# Patient Record
Sex: Male | Born: 1985 | Race: White | Hispanic: No | Marital: Married
Health system: Midwestern US, Community
[De-identification: ages and names within clinical notes are randomized; demographics above are authoritative.]

## PROBLEM LIST (undated history)

## (undated) ENCOUNTER — Emergency Department (HOSPITAL_COMMUNITY): Admission: EM | Payer: Self-pay | Source: Home / Self Care

## (undated) ENCOUNTER — Ambulatory Visit: Admission: EM | Source: Home / Self Care

## (undated) DIAGNOSIS — R42 Dizziness and giddiness: Secondary | ICD-10-CM

## (undated) DIAGNOSIS — R61 Generalized hyperhidrosis: Secondary | ICD-10-CM

## (undated) DIAGNOSIS — F411 Generalized anxiety disorder: Principal | ICD-10-CM

## (undated) DIAGNOSIS — R000 Tachycardia, unspecified: Secondary | ICD-10-CM

## (undated) HISTORY — DX: Tachycardia, unspecified: R00.0

## (undated) HISTORY — DX: Generalized anxiety disorder: F41.1

## (undated) HISTORY — PX: WISDOM TOOTH EXTRACTION: SHX21

## (undated) HISTORY — DX: Generalized hyperhidrosis: R61

## (undated) HISTORY — DX: Dizziness and giddiness: R42

---

## 2008-12-09 ENCOUNTER — Ambulatory Visit: Payer: Self-pay | Admitting: Family Medicine

## 2008-12-09 DIAGNOSIS — I499 Cardiac arrhythmia, unspecified: Secondary | ICD-10-CM | POA: Insufficient documentation

## 2008-12-09 DIAGNOSIS — R6882 Decreased libido: Secondary | ICD-10-CM

## 2008-12-09 LAB — CONVERTED CEMR LAB: TSH: 4.76 microintl units/mL (ref 0.35–5.50)

## 2008-12-12 ENCOUNTER — Telehealth: Payer: Self-pay | Admitting: Family Medicine

## 2008-12-12 ENCOUNTER — Encounter: Payer: Self-pay | Admitting: Family Medicine

## 2008-12-16 ENCOUNTER — Telehealth: Payer: Self-pay | Admitting: Family Medicine

## 2008-12-17 ENCOUNTER — Ambulatory Visit: Payer: Self-pay | Admitting: Endocrinology

## 2008-12-17 DIAGNOSIS — E230 Hypopituitarism: Secondary | ICD-10-CM | POA: Insufficient documentation

## 2008-12-17 DIAGNOSIS — E291 Testicular hypofunction: Secondary | ICD-10-CM

## 2008-12-17 LAB — CONVERTED CEMR LAB
FSH: 4.8 milliintl units/mL (ref 1.4–18.1)
LH: 4.39 milliintl units/mL (ref 1.50–9.30)
Testosterone: 340.08 ng/dL — ABNORMAL LOW (ref 350.00–890.00)

## 2009-12-26 ENCOUNTER — Ambulatory Visit: Payer: Self-pay | Admitting: Family Medicine

## 2009-12-26 DIAGNOSIS — J029 Acute pharyngitis, unspecified: Secondary | ICD-10-CM | POA: Insufficient documentation

## 2010-01-23 ENCOUNTER — Ambulatory Visit: Payer: Self-pay | Admitting: Family Medicine

## 2010-01-23 DIAGNOSIS — G4700 Insomnia, unspecified: Secondary | ICD-10-CM

## 2010-05-28 ENCOUNTER — Ambulatory Visit: Payer: Self-pay | Admitting: Family Medicine

## 2010-05-28 DIAGNOSIS — E039 Hypothyroidism, unspecified: Secondary | ICD-10-CM | POA: Insufficient documentation

## 2010-05-28 LAB — CONVERTED CEMR LAB
AST: 26 units/L (ref 0–37)
Albumin: 4.5 g/dL (ref 3.5–5.2)
Alkaline Phosphatase: 66 units/L (ref 39–117)
Basophils Absolute: 0 10*3/uL (ref 0.0–0.1)
Bilirubin, Direct: 0 mg/dL (ref 0.0–0.3)
Calcium: 9 mg/dL (ref 8.4–10.5)
GFR calc non Af Amer: 107.06 mL/min (ref >60)
Glucose, Bld: 87 mg/dL (ref 70–99)
Hemoglobin: 14.3 g/dL (ref 13.0–17.0)
Lymphocytes Relative: 30.3 % (ref 12.0–46.0)
Monocytes Relative: 7 % (ref 3.0–12.0)
Neutro Abs: 3.6 10*3/uL (ref 1.4–7.7)
RBC: 4.31 M/uL (ref 4.22–5.81)
RDW: 12.8 % (ref 11.5–14.6)
Sodium: 142 meq/L (ref 135–145)
Total Bilirubin: 0.5 mg/dL (ref 0.3–1.2)

## 2010-06-01 ENCOUNTER — Telehealth: Payer: Self-pay | Admitting: Family Medicine

## 2010-07-06 ENCOUNTER — Telehealth: Payer: Self-pay | Admitting: Family Medicine

## 2010-07-14 ENCOUNTER — Encounter: Payer: Self-pay | Admitting: Family Medicine

## 2010-07-20 ENCOUNTER — Encounter: Payer: Self-pay | Admitting: Family Medicine

## 2010-07-20 ENCOUNTER — Ambulatory Visit: Payer: Self-pay

## 2010-07-20 ENCOUNTER — Ambulatory Visit: Payer: Self-pay | Admitting: Family Medicine

## 2010-07-23 ENCOUNTER — Ambulatory Visit: Payer: Self-pay | Admitting: Cardiology

## 2010-07-23 DIAGNOSIS — R002 Palpitations: Secondary | ICD-10-CM | POA: Insufficient documentation

## 2010-07-28 ENCOUNTER — Ambulatory Visit: Payer: Self-pay | Admitting: Cardiology

## 2010-07-31 ENCOUNTER — Encounter: Payer: Self-pay | Admitting: Cardiology

## 2010-08-03 ENCOUNTER — Encounter (INDEPENDENT_AMBULATORY_CARE_PROVIDER_SITE_OTHER): Payer: Self-pay | Admitting: *Deleted

## 2010-08-03 ENCOUNTER — Ambulatory Visit: Payer: Self-pay

## 2010-08-03 ENCOUNTER — Ambulatory Visit (HOSPITAL_COMMUNITY)
Admission: RE | Admit: 2010-08-03 | Discharge: 2010-08-03 | Payer: Self-pay | Source: Home / Self Care | Attending: Cardiology | Admitting: Cardiology

## 2010-08-03 ENCOUNTER — Encounter: Payer: Self-pay | Admitting: Cardiology

## 2010-09-02 ENCOUNTER — Ambulatory Visit
Admission: RE | Admit: 2010-09-02 | Discharge: 2010-09-02 | Payer: Self-pay | Source: Home / Self Care | Attending: Cardiology | Admitting: Cardiology

## 2010-09-22 NOTE — Progress Notes (Signed)
  Phone Note Outgoing Call   Summary of Call: patient referred to Dr. Narda Bonds, ENT for further regulation.  Basic labs, normal Initial call taken by: Roderick Pee MD,  June 01, 2010 8:09 AM

## 2010-09-22 NOTE — Assessment & Plan Note (Signed)
Summary: elevated heart rate/cjr   Vital Signs:  Patient profile:   25 year old male Weight:      180 pounds Temp:     98.5 degrees F BP sitting:   120 / 84  (left arm) Cuff size:   regular  Vitals Entered By: Kern Reap CMA Duncan Dull) (July 20, 2010 10:37 AM) CC: heart racing    CC:  heart racing .  Allergies: No Known Drug Allergies   Complete Medication List: 1)  Ambien 10 Mg Tabs (Zolpidem tartrate) .Marland Kitchen.. 1 tab @ bedtime  Other Orders: Cardiology Referral (Cardiology) EKG w/ Interpretation (93000)   Orders Added: 1)  Cardiology Referral [Cardiology] 2)  EKG w/ Interpretation [93000] 3)  Est. Patient Level III [16109]

## 2010-09-22 NOTE — Assessment & Plan Note (Signed)
Summary: THYROID CONCERNS // RS..R/S PER MD//SLM   Vital Signs:  Patient profile:   25 year old male Weight:      178 pounds Temp:     98.0 degrees F oral BP sitting:   140 / 80  (left arm) Cuff size:   regular  Vitals Entered By: Kern Reap CMA Duncan Dull) (May 28, 2010 4:40 PM) CC: thyroid concerns Is Patient Diabetic? No Pain Assessment Patient in pain? no        CC:  thyroid concerns.  History of Present Illness: Benjamin Peters is a 25 year old male, who comes in today because he is concerned he may have a thyroid problem.  He brings in a long list of symptoms, that he's had over the last couple years.  It suggesting may have a thyroid problem.  Is also for the past couple months noted alteration in smell and taste.  For example, he states that every since peanut butter cup tastes like charcoal.  Review of systems negative.  Family history mother and two sisters have hypothyroidism  Allergies (verified): No Known Drug Allergies  Past History:  Past medical, surgical, family and social histories (including risk factors) reviewed, and no changes noted (except as noted below).  Past Medical History: Reviewed history from 12/09/2008 and no changes required. arrythmia hx hematuria post strep vertigo  Past Surgical History: Reviewed history from 12/09/2008 and no changes required. wisdom teeth extraction  Family History: Reviewed history from 12/17/2008 and no changes required. Father: HTN Mother: obesity Siblings: 2 younger - healthy neg for testosterone problem or male infertility Family History Thyroid disease  Social History: Reviewed history from 12/09/2008 and no changes required. Occupation:school teacher - social studies Married Never Smoked Alcohol use-no Drug use-no Regular exercise-yes  Review of Systems      See HPI       Flu Vaccine Consent Questions     Do you have a history of severe allergic reactions to this vaccine? no    Any prior  history of allergic reactions to egg and/or gelatin? no    Do you have a sensitivity to the preservative Thimersol? no    Do you have a past history of Guillan-Barre Syndrome? no    Do you currently have an acute febrile illness? no    Have you ever had a severe reaction to latex? no    Vaccine information given and explained to patient? yes    Are you currently pregnant? no    Lot Number:[id number]   Exp Date:02/20/2011   Site Given  Left Deltoid IM   Physical Exam  General:  Well-developed,well-nourished,in no acute distress; alert,appropriate and cooperative throughout examination Neck:  No deformities, masses, or tenderness noted.   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 1 tab @ bedtime  Other Orders: Admin 1st Vaccine (98119) Flu Vaccine 52yrs + (14782)  Patient Instructions: 1)  I will call you when  I get your lab work back Prescriptions: AMBIEN 10 MG TABS (ZOLPIDEM TARTRATE) 1 tab @ bedtime  #30 x 3   Entered and Authorized by:   Roderick Pee MD  Signed by:   Roderick Pee MD on 05/28/2010   Method used:   Print then Give to Patient   RxID:   [id number]

## 2010-09-22 NOTE — Progress Notes (Signed)
Summary: Pt req referral in cardiologist  Phone Note Call from Patient Call back at Home Phone [phone]   Caller: Patient Summary of Call: Pt called and is req a referral to cardiologist for recurring random heart palpitations. Pt req to have Echo-Cardiogram, 24hr Halter, 24hr urine sample and CT Scan of Edrinal glands.       Initial call taken by: Lucy Antigua,  July 06, 2010 9:57 AM  Follow-up for Phone Call         place set up a consult with Dr. Marca Ancona, in cardiology.  Diagnostic studies will be ordered by him Follow-up by: Roderick Pee MD,  July 06, 2010 10:19 AM

## 2010-09-22 NOTE — Assessment & Plan Note (Signed)
Summary: PINKEYE/PS   Vital Signs:  Patient profile:   25 year old male Weight:      167 pounds BMI:     21.52 Temp:     97.9 degrees F oral BP sitting:   130 / 90  (right arm)  Vitals Entered By: Kathrynn Speed CMA (January 23, 2010 1:36 PM) CC: Pink Eye/ Can't sleep   CC:  Pink Eye/ Can't sleep.  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 who comes in today for evaluation of two problems.  Yesterday he noticed some redness of his left eye.  No pain no visual disturbance.  He's also had trouble sleeping for the past month.  His grandmother, who he was very close to died.  He's not been able to go to sleep till 4 o'clock in the morning until last weekend when his grandfather gave him an Ambien tablet and he slept all night.  He would like some Ambien.  He denies any history of depression  Current Medications (verified): 1)  None  Allergies (verified): No Known Drug Allergies  Physical Exam  General:  Well-developed,well-nourished,in no acute distress; alert,appropriate and cooperative throughout examination Head:  Normocephalic and atraumatic without obvious abnormalities. No apparent alopecia or balding. Eyes:  right eye, normal left eye shows some erythema of the conjunctiva which does not extend over the cornea.  Visual acuity 2020 both eyes   Problems:  Medical Problems Added: 1)  Dx of Conjunctivitis  (ICD-372.30) 2)  Dx of Insomnia-sleep Disorder-unspec  (ICD-780.52)  Impression & Recommendations:  Problem # 1:  CONJUNCTIVITIS (ICD-372.30) Assessment New  His updated medication list for this problem includes:    Genoptic 0.3 % Soln (Gentamicin sulfate) .Marland Kitchen... 1 gtt two times a day  Orders: Prescription Created Electronically [phone])  Problem # 2:  INSOMNIA-SLEEP DISORDER-UNSPEC (ICD-780.52) Assessment: New  His updated medication list for this problem includes:    Ambien 10 Mg Tabs (Zolpidem tartrate)  .Marland Kitchen... 1 tab @ bedtime  Complete Medication List: 1)  Genoptic 0.3 % Soln (Gentamicin sulfate) .Marland Kitchen.. 1 gtt two times a day 2)  Ambien 10 Mg Tabs (Zolpidem tartrate) .Marland Kitchen.. 1 tab @ bedtime  Patient Instructions: 1)  use one drop of the medicated eye drops in ear, left eye twice a day until the redness is gone. 2)  Ambien 10 mg dose one half tab nightly Prescriptions: GENOPTIC 0.3 % SOLN (GENTAMICIN SULFATE) 1 gtt two times a day  #10 cc x 1   Entered and Authorized by:   Roderick Pee MD   Signed by:   Roderick Pee MD on 01/23/2010   Method used:   Electronically to        [facility]  [phone]* (retail)       [address]       [place], [place]  [address]       Ph: [phone] or [phone]       Fax: [phone]   RxID:   [id number] AMBIEN 10 MG TABS (ZOLPIDEM TARTRATE) 1 tab @ bedtime  #30 x 1   Entered and Authorized by:   Roderick Pee MD   Signed by:   Roderick Pee MD on 01/23/2010   Method used:   Print then Give to Patient   RxID:   [id number] GENOPTIC 0.3 % SOLN (GENTAMICIN SULFATE) 1 gtt two times a day  #10 cc x 1   Entered  and Authorized by:   Roderick Pee MD   Signed by:   Roderick Pee MD on 01/23/2010   Method used:   Electronically to        [facility]  [phone]* (retail)       [address]       [place], [place]  [address]       Ph: [phone] or [phone]       Fax: [phone]   RxID:   [id number]

## 2010-09-22 NOTE — Assessment & Plan Note (Signed)
Summary: cough/njr   Vital Signs:  Patient profile:   25 year old male Weight:      162 pounds Temp:     98.7 degrees F oral BP sitting:   120 / 90  (right arm) CC: Cough, Sore throat   CC:  Cough and Sore throat.  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  Three days ago, he developed cough, fever, chills yesterday, he developed a bad sore throat.  Review of systems otherwise negative.  One of his fellow, teachers at school, has been sick with similar symptoms  Allergies: No Known Drug Allergies  Past History:  Past medical, surgical, family and social histories (including risk factors) reviewed for relevance to current acute and chronic problems.  Past Medical History: Reviewed history from 12/09/2008 and no changes required. arrythmia hx hematuria post strep vertigo  Past Surgical History: Reviewed history from 12/09/2008 and no changes required. wisdom teeth extraction  Family History: Reviewed history from 12/17/2008 and no changes required. Father: HTN Mother: obesity Siblings: 2 younger - healthy neg for testosterone problem or male infertility  Social History: Reviewed history from 12/09/2008 and no changes required. Occupation:school teacher - social studies Married Never Smoked Alcohol use-no Drug use-no Regular exercise-yes  Review of Systems      See HPI  Physical Exam  General:  Well-developed,well-nourished,in no acute distress; alert,appropriate and cooperative throughout examination Head:  Normocephalic and atraumatic without obvious abnormalities. No apparent alopecia or balding. Eyes:  No corneal or conjunctival inflammation noted. EOMI. Perrla. Funduscopic exam benign, without hemorrhages, exudates or papilledema. Vision grossly normal. Ears:  External ear exam shows no significant lesions or deformities.  Otoscopic examination  reveals clear canals, tympanic membranes are intact bilaterally without bulging, retraction, inflammation or discharge. Hearing is grossly normal bilaterally. Nose:  External nasal examination shows no deformity or inflammation. Nasal mucosa are pink and moist without lesions or exudates. Mouth:  Oral mucosa and oropharynx without lesions or exudates.  Teeth in good repair. Neck:  No deformities, masses, or tenderness noted. Chest Wall:  No deformities, masses, tenderness or gynecomastia noted. Lungs:  Normal respiratory effort, chest expands symmetrically. Lungs are clear to auscultation, no crackles or wheezes.   Impression & Recommendations:  Problem # 1:  SORE THROAT (ICD-462) Assessment New  Orders: Rapid Strep (16109)  Complete Medication List: 1)  Hydromet 5-1.5 Mg/1ml Syrp (Hydrocodone-homatropine) .Marland Kitchen.. 1 to 2 tsps three times a day as needed  Patient Instructions: 1)  drink lots of liquids, suck on Chloraseptic lozenges, Hydromet one to 2 teaspoons t.i.d., p.r.n. sore throat, cough.  Return p.r.n. Prescriptions: HYDROMET 5-1.5 MG/5ML SYRP (HYDROCODONE-HOMATROPINE) 1 to 2 tsps three times a day as needed  #8oz x 1   Entered and Authorized by:   Roderick Pee MD   Signed by:   Roderick Pee MD on 12/26/2009   Method used:   Print then Give to Patient   RxID:   [id number]   Appended Document: Office Visit - Infectious Disease     Allergies: No Known Drug Allergies   Complete Medication List: 1)  Hydromet 5-1.5 Mg/15ml Syrp (Hydrocodone-homatropine) .Marland Kitchen.. 1 to 2 tsps three times a day as needed   Laboratory Results  Date/Time Received: Dec 26, 2009 4:47 PM  Date/Time Reported: Dec 26, 2009 4:47 PM   Other Tests  Rapid Strep: negative Comments: Kathrynn Speed CMA  Dec 26, 2009 4:47 PM

## 2010-09-22 NOTE — Assessment & Plan Note (Signed)
Summary: cardiac arrythmia/mt   Visit Type:  Initial Consult Primary Provider:  Roderick Pee MD  CC:  irregular heart beat.  History of Present Illness: 25 yo patient of Dr. Tawanna Cooler presents for evaluation of palpitations/sensation of racing heart rate as well as profuse diaphoresis/heat intolerance.  For the last 2-3 years, patient has noted frequent palpitations/heart fluttering.  This has been bothersome but momentary.  He has been off caffeine completely for 2 years without much effect.  More recently, he has noted that his heart rate increases excessively at times, especially when he "overheats."  He notes extreme heat intolerance with profuse diaphoresis.  When this happens, his heart will race and he will fee lightheaded.  No syncope.  Monday early morning he woke up with a very severe episode where he felt his heart racing.  This lasted for 10-15 minutes.  He notes this extreme heat intolerance especially when he tries to work out at [employer]  Sometimes it gets so bad (diaphoresis, heart racing, lightheadedness) that he has to lie down.  He has been wearing an event monitor for several days now.  It shows sinus rhythm and sinus tachycardia.  There has been one episode of tachycardia to the 150s.  This is difficult to interpret on the monitor strip and is most likely sinus tachycardia but cannot rule out SVT.  Patient does not have a history of HTN.   ECG: NSR with right axis deviation.  Labs (10/11): K 3.5, creatinine 0.9, TSH/free T4/free T3 normal  Current Medications (verified): 1)  Ambien 10 Mg Tabs (Zolpidem Tartrate) .Marland Kitchen.. 1 Tab @ Bedtime As Needed 2)  Multivitamins   Tabs (Multiple Vitamin) .... Once Daily  Allergies (verified): No Known Drug Allergies  Past History:  Past Medical History: 1. hx hematuria post strep 2. vertigo: sounds consistent with benign paroxysmal positional vertigo 3. Tachypalpitations  Family History: Father: HTN Mother: obesity Siblings: 2 younger  - healthy neg for testosterone problem or male infertility Family History Thyroid disease  No premature CAD.   Social History: Reviewed history from 12/09/2008 and no changes required. Occupation:school teacher - social studies Married Never Smoked Alcohol use-no Drug use-no Regular exercise-yes  Review of Systems       All systems reviewed and negative except as per HPI.   Vital Signs:  Patient profile:   25 year old male Height:      74 inches Weight:      178 pounds BMI:     22.94 Pulse rate:   96 / minute BP sitting:   128 / 78  (left arm) Cuff size:   regular  Vitals Entered By: Hardin Negus, RMA (July 23, 2010 2:23 PM)  Physical Exam  General:  Well developed, well nourished, in no acute distress. Head:  normocephalic and atraumatic Nose:  no deformity, discharge, inflammation, or lesions Mouth:  Teeth, gums and palate normal. Oral mucosa normal. Neck:  Neck supple, no JVD. No masses, thyromegaly or abnormal cervical nodes. Lungs:  Clear bilaterally to auscultation and percussion. Heart:  Hyperdynamic PMI, chest non-tender; regular rate and rhythm, S1, S2 without murmurs, rubs. +S4. Carotid upstroke normal, no bruit.  Pedals normal pulses. No edema, no varicosities. Abdomen:  Bowel sounds positive; abdomen soft and non-tender without masses, organomegaly, or hernias noted. No hepatosplenomegaly. Msk:  Back normal, normal gait. Muscle strength and tone normal. Extremities:  No clubbing or cyanosis. Neurologic:  Alert and oriented x 3. Skin:  Intact without lesions or rashes. Psych:  Normal affect.   Impression & Recommendations:  Problem # 1:  PALPITATIONS (ICD-785.1) Patient has very bothersome episodes where his heart races excesssively.  He gets lightheaded but has never passed out.  These episodes tend to be associated with profuse diaphoresis and heat intolerance.  His thyroid indices have been normal.  Event monitor has shown one episode with rate  up to 150 that is sinus tachycardia versus SVT.   - I think that even though the patient does not have a history of HTN, he needs to have pheochromocytoma ruled out as it could potentially present like this.  Will get 24 hour urine catecholamine collection as well as serum metanephrines.  - Echo to make sure that heart is structurally normal.  - Given quite bothersome symptoms and a lot of anxiety, I am going to start him on propranolol 40 mg two times a day. - Complete event monitor.  - Followup 1 month after testing complete.   Other Orders: EKG w/ Interpretation (93000) Metanephrines, Plasma [phone]) T- * Misc. Laboratory test [phone]) Echocardiogram (Echo)  Patient Instructions: 1)  Your physician has recommended you make the following change in your medication:  2)  Start Propranolol 40mg  twice a day--this will be two 20mg  tablets twice a day. 3)  Your physician recommends that you have lab today --serum metanephrine and a 24 urine collection for catecholamines  427.9  4)  Your physician has requested that you have an echocardiogram.  Echocardiography is a painless test that uses sound waves to create images of your heart. It provides your doctor with information about the size and shape of your heart and how well your heart's chambers and valves are working.  This procedure takes approximately one hour. There are no restrictions for this procedure. 5)  Your physician recommends that you schedule a follow-up appointment in: 1 month with Dr Shirlee Latch. Prescriptions: PROPRANOLOL HCL 20 MG TABS (PROPRANOLOL HCL) Take two tablets  by mouth twice a day  #120 x 6   Entered by:   Katina Dung, RN, BSN   Authorized by:   Marca Ancona, MD   Signed by:   Katina Dung, RN, BSN on 07/23/2010   Method used:   Electronically to        [facility]  [phone]* (retail)       [address]       [place], [place]  [address]       Ph: [phone] or [phone]        Fax: [phone]   RxID:   [id number]

## 2010-09-22 NOTE — Medication Information (Signed)
Summary: Prior Authorization and Approval for Zolpidem Tartrate  Prior Authorization and Approval for Zolpidem Tartrate   Imported By: Maryln Gottron 07/24/2010 10:16:24  _____________________________________________________________________  External Attachment:    Type:   Image     Comment:   External Document

## 2010-09-24 NOTE — Assessment & Plan Note (Signed)
Summary: 1 MONTH ROV   Visit Type:  Follow-up Primary Provider:  Roderick Pee MD  CC:  no complaints.  History of Present Illness: 25 yo returns for evaluation of palpitations/sensation of racing heart rate as well as profuse diaphoresis/heat intolerance.  For the last 2-3 years, patient has noted frequent palpitations/heart fluttering.  This has been bothersome but momentary.  He has been off caffeine completely for 2 years without much effect.  More recently, he has noted that his heart rate increases excessively at times, especially when he "overheats."  He notes extreme heat intolerance with profuse diaphoresis.  When this happens, his heart will race and he will fee lightheaded.  No syncope.  He notes extreme heat intolerance especially when he tries to work out at [employer]  Sometimes it would get so bad (diaphoresis, heart racing, lightheadedness) that he has to lie down.    Patient wore an event monitor that showed short runs of possible SVT with a maximal rate around 150.  He was started on propranolol mid-way through his time with the event monitor and states that palpitations resolved completely after starting propranolol.  No further tachycardic runs on monitor after that point.  He continues to feel like he sweats excessively.  We checked him for pheochromocytoma.  Urine and serum lab workup was negative.  Overall, he is feeling better without the palpitations.  Echo showed preserved LV and RV size and function.  Patient has tolerated propranolol well with no lightheadedness or fatigue.   Labs (10/11): K 3.5, creatinine 0.9, TSH/free T4/free T3 normal Labs (12/11): Urinary catecholeamines and plasma metanephrines/catecholeamines were normal  Current Medications (verified): 1)  Ambien 10 Mg Tabs (Zolpidem Tartrate) .Marland Kitchen.. 1 Tab @ Bedtime As Needed 2)  Multivitamins   Tabs (Multiple Vitamin) .... Once Daily 3)  Propranolol Hcl 20 Mg Tabs (Propranolol Hcl) .... Take Two Tablets  By Mouth  Twice A Day  Allergies (verified): No Known Drug Allergies  Past History:  Past Medical History: 1. hx hematuria post strep 2. vertigo: sounds consistent with benign paroxysmal positional vertigo 3. Tachypalpitations: 3 month event monitor showed runs of possible SVT with rate in 150s (difficult baseline so cannot completely rule out sinus tachycardia). Improved with propranolol.  4. Excessive diaphoresis: Lab workup for pheochromocytoma was negative.  5. Echo (12/11): EF 50-55%, normal diastolic function, no LVH, valves ok.   Family History: Reviewed history from 07/23/2010 and no changes required. Father: HTN Mother: obesity Siblings: 2 younger - healthy neg for testosterone problem or male infertility Family History Thyroid disease  No premature CAD.   Social History: Reviewed history from 12/09/2008 and no changes required. Occupation:school teacher - social studies Married Never Smoked Alcohol use-no Drug use-no Regular exercise-yes  Vital Signs:  Patient profile:   25 year old male Height:      74 inches Weight:      179.75 pounds BMI:     23.16 Pulse rate:   44 / minute BP sitting:   112 / 68  (right arm) Cuff size:   regular  Vitals Entered By: Micki Riley CNA (September 02, 2010 4:42 PM)  Physical Exam  General:  Well developed, well nourished, in no acute distress. Neck:  Neck supple, no JVD. No masses, thyromegaly or abnormal cervical nodes. Lungs:  Clear bilaterally to auscultation and percussion. Heart:  Hyperdynamic PMI, chest non-tender; regular rate and rhythm, S1, S2 without murmurs, rubs. Carotid upstroke normal, no bruit.  Pedals normal pulses. No edema, no  varicosities. Abdomen:  Bowel sounds positive; abdomen soft and non-tender without masses, organomegaly, or hernias noted. No hepatosplenomegaly. Extremities:  No clubbing or cyanosis. Neurologic:  Alert and oriented x 3. Psych:  Normal affect.   Impression & Recommendations:  Problem  # 1:  PALPITATIONS (ICD-785.1) Possible SVT on monitor.  This seems to be suppressed by propranolol and patient feels better.  Will continue propranolol.  No significant side effects at this time.  Echo with preserved cardiac function.   Problem # 2:  DIAPHORESIS Workup for pheochromocytoma was negative and thyroid indices were normal.  ? referral to endocrinology.   Patient Instructions: 1)  Your physician recommends that you continue on your current medications as directed. Please refer to the Current Medication list given to you today. 2)  Your physician wants you to follow-up in: 1 year.  You will receive a reminder letter in the mail two months in advance. If you don't receive a letter, please call our office to schedule the follow-up appointment.

## 2010-09-24 NOTE — Letter (Signed)
Summary: Outpatient Coinsurance Notice  Outpatient Coinsurance Notice   Imported By: Marylou Mccoy 08/10/2010 18:57:46  _____________________________________________________________________  External Attachment:    Type:   Image     Comment:   External Document

## 2011-01-11 ENCOUNTER — Telehealth: Payer: Self-pay | Admitting: Cardiology

## 2011-01-11 NOTE — Telephone Encounter (Signed)
Left a message to call back.

## 2011-01-11 NOTE — Telephone Encounter (Signed)
Pt has question regarding the propranolol 40 mg twice a day.

## 2011-01-12 NOTE — Telephone Encounter (Signed)
He can try to lower the dose to 20 mg bid.  It is purely for symptoms so if he is feeling worse on the medication than off, he should stop it.  We could try using a calcium channel blocker like diltiazem instead (could try diltiazem cd 120 mg daily).

## 2011-01-12 NOTE — Telephone Encounter (Signed)
I talked with pt. Pt taking Propranolol 40mg  twice a day. Since February pt has felt fatigued and not as energetic as in the past. Pt states he has gained weight because he does not feel as energetic.  Pt states he does not feel the medication has not been effective in suppressing the heart rhythm problem that he was first evaluated for. Pt states he still feels his heart beats backwards about once a week. This is less often than in the past. Pt states the monitor he ware did not show any significant rhythm problem. Pt asking if medication can be changed or adjusted.

## 2011-01-12 NOTE — Telephone Encounter (Signed)
I discussed with pt. For right now pt will decrease Propranolol to 20mg  bid. He will see how he gets along symptomatically on the lower dose of Propranolol. He will call back if he decides he would like to try Diltiazem CD 120mg .

## 2011-05-12 ENCOUNTER — Encounter: Payer: Self-pay | Admitting: Family Medicine

## 2011-05-12 ENCOUNTER — Ambulatory Visit (INDEPENDENT_AMBULATORY_CARE_PROVIDER_SITE_OTHER): Payer: BC Managed Care – PPO | Admitting: Family Medicine

## 2011-05-12 VITALS — BP 102/74 | Temp 98.7°F | Wt 184.0 lb

## 2011-05-12 DIAGNOSIS — J029 Acute pharyngitis, unspecified: Secondary | ICD-10-CM

## 2011-05-12 NOTE — Patient Instructions (Signed)
Viral Pharyngitis  Viral pharyngitis is a sore throat caused by a cold virus. This is not a strep throat. This does not require treatment by an antibiotic (medications that kill germs). It will get better on its own. Antibiotics generally will not help unless this condition is followed by a bacterial (germ) infection.  HOME CARE INSTRUCTIONS   Drink or give your child plenty of fluids. Soft, cold foods such as ice cream, popsicles, or jello may be used.    Gargle with warm salt water (one teaspoon salt per quart of water).    If over age 7, throat lozenges may be used safely.    Only take over-the-counter or prescription medicines for pain, discomfort, or fever as directed by your caregiver. DO NOT USE ASPIRIN.   SEEK MEDICAL CARE IF:   You are better in a few days, then become worse.    You have a fever or pain unresponsive to pain medicines.    There are any other changes that concern you.   Diagnostic tests may have been performed. If you have not received results at time of discharge, receive instructions as how to obtain these results.   Document Released: 05/19/2005 Document Re-Released: 01/26/2008  ExitCare Patient Information 2011 [employer].

## 2011-05-12 NOTE — Progress Notes (Signed)
  Subjective:    Patient ID: Benjamin Peters, male    DOB: 06-14-1986, 25 y.o.   MRN: [id number]  HPI Patient seen with sore throat. Onset today. Over the weekend developed some vertigo which has been recurrent process for him. He has nasal congestion of the weekend. Rare cough. No fever or chills. Patient denies any nausea, vomiting, or diarrhea. He has not taken any alleviating medications.   Review of Systems  Constitutional: Negative for fever, chills and appetite change.  HENT: Positive for congestion and sore throat. Negative for trouble swallowing and voice change.   Respiratory: Negative for cough, shortness of breath and wheezing.   Cardiovascular: Negative for chest pain.  Skin: Negative for rash.  Hematological: Negative for adenopathy.       Objective:   Physical Exam  Constitutional: He appears well-developed and well-nourished. No distress.  HENT:  Right Ear: External ear normal.  Left Ear: External ear normal.  Mouth/Throat: Oropharynx is clear and moist.       Mild erythema without exudate  Neck: Neck supple.  Cardiovascular: Normal rate, regular rhythm and normal heart sounds.   Pulmonary/Chest: Effort normal and breath sounds normal. No respiratory distress. He has no wheezes. He has no rales.  Lymphadenopathy:    He has no cervical adenopathy.          Assessment & Plan:  Pharyngitis. Rapid strep negative. Suspect viral origin. Treat symptomatically with over-the-counter medications. Followup as needed

## 2011-06-03 ENCOUNTER — Ambulatory Visit: Payer: BC Managed Care – PPO | Admitting: Internal Medicine

## 2011-06-14 ENCOUNTER — Ambulatory Visit (INDEPENDENT_AMBULATORY_CARE_PROVIDER_SITE_OTHER): Payer: BC Managed Care – PPO | Admitting: Family Medicine

## 2011-06-14 ENCOUNTER — Encounter: Payer: Self-pay | Admitting: Family Medicine

## 2011-06-14 VITALS — BP 100/68 | Temp 98.1°F | Wt 180.0 lb

## 2011-06-14 DIAGNOSIS — J45901 Unspecified asthma with (acute) exacerbation: Secondary | ICD-10-CM

## 2011-06-14 MED ORDER — HYDROCODONE-HOMATROPINE 5-1.5 MG/5ML PO SYRP: ORAL_SOLUTION | ORAL | Status: DC

## 2011-06-14 MED ORDER — PREDNISONE 20 MG PO TABS: ORAL_TABLET | ORAL | Status: DC

## 2011-06-14 NOTE — Patient Instructions (Signed)
Drink lots of water.  Take the prednisone as directed.  One half to 1 teaspoon of the cough syrup at bedtime as needed.  Return p.r.n.

## 2011-06-14 NOTE — Progress Notes (Signed)
  Subjective:    Patient ID: Benjamin Peters, male    DOB: 12/30/1985, 25 y.o.   MRN: [id number]  HPI Benjamin Peters is a 25 year old, married male, nonsmoker, who comes in with a two week history of a cold and a persistent cough.  He has had a viral syndrome now for about two weeks.  Today he feels like he is coughing and wheezing.  No history of asthma in the past.  He is a nonsmoker   Review of Systems General and pulmonary venous systems otherwise negative   Objective:   Physical Exam  Well-developed well-nourished, male in no acute distress.  Examination of the HEENT were negative.  Neck was supple.  No adenopathy.  Lungs were clear except for late bilateral mild expiratory wheezing      Assessment & Plan:  Viral syndrome with secondary asthma.  Plan prednisone burst and taper

## 2011-07-05 ENCOUNTER — Ambulatory Visit (INDEPENDENT_AMBULATORY_CARE_PROVIDER_SITE_OTHER): Payer: BC Managed Care – PPO | Admitting: Internal Medicine

## 2011-07-05 ENCOUNTER — Encounter: Payer: Self-pay | Admitting: Internal Medicine

## 2011-07-05 DIAGNOSIS — Z0000 Encounter for general adult medical examination without abnormal findings: Secondary | ICD-10-CM

## 2011-07-05 DIAGNOSIS — R6889 Other general symptoms and signs: Secondary | ICD-10-CM | POA: Insufficient documentation

## 2011-07-05 DIAGNOSIS — T679XXA Effect of heat and light, unspecified, initial encounter: Secondary | ICD-10-CM

## 2011-07-05 DIAGNOSIS — E291 Testicular hypofunction: Secondary | ICD-10-CM

## 2011-07-05 DIAGNOSIS — R002 Palpitations: Secondary | ICD-10-CM

## 2011-07-05 NOTE — Assessment & Plan Note (Addendum)
Td-- due and done today DRE done at pt's request ("my insurance demands it") Flu shot-- had already Diet-exercise-STE discussed Labs

## 2011-07-05 NOTE — Progress Notes (Signed)
  Subjective:    Patient ID: Benjamin Peters, male    DOB: 07-14-1986, 25 y.o.   MRN: [id number]  HPI Transferring from another office, needs a complete physical exam. He has 2 additional issues, heat intolerance and  Arrhythmias, see assessment and plan.  Past Medical History  Diagnosis Date  . Hematuria     hx - post strep (as a teenager)  . 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 Tachycardia   . Diaphoresis     excessive   Past Surgical History  Procedure Date  . Wisdom tooth extraction    History   Social History  . Marital Status: Married    Spouse Name: N/A    Number of Children: 0  . Years of Education: N/A   Occupational History  . teacher , high school    Social History Main Topics  . Smoking status: Never Smoker   . Smokeless tobacco: Never Used  . Alcohol Use: No  . Drug Use: No  . Sexually Active: Not on file   Other Topics Concern  . Not on file   Social History Narrative  . No narrative on file   Family History  Problem Relation Age of Onset  . Obesity Mother   . Hypertension Father   . Thyroid disease      "mother side", sisters   . Colon cancer Neg Hx   . Prostate cancer Neg Hx   . Coronary artery disease      GM MI, in her 21s     Review of Systems No fever or chills No weight loss No nausea, vomiting, diarrhea No chest pain or shortness of breath No dysuria or gross hematuria No anxiety or depression.     Objective:   Physical Exam  Constitutional: He is oriented to person, place, and time. He appears well-developed and well-nourished. No distress.  HENT:  Head: Normocephalic and atraumatic.  Neck: No thyromegaly present.  Cardiovascular: Normal rate and regular rhythm.   Pulmonary/Chest: Effort normal and breath sounds normal. No respiratory distress. He has no wheezes. He has no rales.  Abdominal: Soft. Bowel sounds are normal. He exhibits no distension. There is no tenderness. There is no rebound and no  guarding.  Genitourinary: Rectum normal and prostate normal.  Musculoskeletal: He exhibits no edema.  Neurological: He is alert and oriented to person, place, and time.  Skin: Skin is warm and dry. He is not diaphoretic.  Psychiatric: He has a normal mood and affect. His behavior is normal. Judgment and thought content normal.       Assessment & Plan:  In addition to his CPX, we spent more than 20 minutes reviewing the chart in reference to additional symptoms.

## 2011-07-05 NOTE — Assessment & Plan Note (Addendum)
palpitations since 2008, described as able to feel his heart beating for a short period of time , no associated red flag symptoms like syncope. Pheochromocytoma was ruled out 07/2010 when he saw cardiology Echocardiogram 07/2010 normal TFTs always normal.  event monitor   showed short runs of possible SVT with a maximal rate around 150, symptoms resolved after propranolol per cards note ---> pt states they did not resolved but he was intolerant to BB due to  fatigue. Discussed retrial w/ BB, declined. PVC? We agreed to observation, will call if sx increase Labs

## 2011-07-05 NOTE — Assessment & Plan Note (Signed)
Labs

## 2011-07-05 NOTE — Assessment & Plan Note (Addendum)
Long history of heat intolerance,  describes the symptoms as  "only comfortable in a very cold environment", "excessively sweaty and nauseous with exertion". Previous workup 1. ECHO, see palpitations  2. TFTs normal 3. pheo has been r/o 4. Low testosterone  Plan: Repeat TSH, testosterone

## 2011-07-06 LAB — COMPREHENSIVE METABOLIC PANEL
AST: 19 U/L (ref 0–37)
Albumin: 4.3 g/dL (ref 3.5–5.2)
BUN: 11 mg/dL (ref 6–23)
CO2: 30 mEq/L (ref 19–32)
Calcium: 9.2 mg/dL (ref 8.4–10.5)
Chloride: 105 mEq/L (ref 96–112)
GFR: 107.53 mL/min (ref >60.00)
Glucose, Bld: 84 mg/dL (ref 70–99)
Potassium: 4.5 mEq/L (ref 3.5–5.1)

## 2011-07-06 LAB — CBC WITH DIFFERENTIAL/PLATELET
Basophils Absolute: 0 10*3/uL (ref 0.0–0.1)
Basophils Relative: 0.6 % (ref 0.0–3.0)
Eosinophils Absolute: 0.2 10*3/uL (ref 0.0–0.7)
Lymphocytes Relative: 29.7 % (ref 12.0–46.0)
MCHC: 34.3 g/dL (ref 30.0–36.0)
Neutrophils Relative %: 59.7 % (ref 43.0–77.0)
RBC: 4.5 Mil/uL (ref 4.22–5.81)

## 2011-07-06 LAB — LIPID PANEL
HDL: 39.6 mg/dL (ref >39.00)
Triglycerides: 71 mg/dL (ref 0.0–149.0)

## 2011-07-06 LAB — TESTOSTERONE, FREE, TOTAL, SHBG: Testosterone-% Free: 2.8 % (ref 1.6–2.9)

## 2011-07-06 LAB — T3, FREE: T3, Free: 2.8 pg/mL (ref 2.3–4.2)

## 2011-07-08 ENCOUNTER — Encounter: Payer: Self-pay | Admitting: Internal Medicine

## 2012-06-27 ENCOUNTER — Encounter: Payer: Self-pay | Admitting: Family Medicine

## 2012-06-27 ENCOUNTER — Ambulatory Visit (INDEPENDENT_AMBULATORY_CARE_PROVIDER_SITE_OTHER): Payer: BC Managed Care – PPO | Admitting: Family Medicine

## 2012-06-27 VITALS — BP 118/72 | HR 58 | Temp 98.6°F | Wt 178.6 lb

## 2012-06-27 DIAGNOSIS — J40 Bronchitis, not specified as acute or chronic: Secondary | ICD-10-CM

## 2012-06-27 DIAGNOSIS — G4700 Insomnia, unspecified: Secondary | ICD-10-CM

## 2012-06-27 MED ORDER — AZITHROMYCIN 250 MG PO TABS: ORAL_TABLET | ORAL | Status: DC

## 2012-06-27 MED ORDER — ZOLPIDEM TARTRATE 10 MG PO TABS: 10.0000 mg | ORAL_TABLET | Freq: Every evening | ORAL | Status: DC | PRN

## 2012-06-27 NOTE — Progress Notes (Signed)
  Subjective:     Benjamin Peters is a 26 y.o. male who presents for evaluation of symptoms of a URI. Symptoms include bilateral ear pressure/pain, congestion, nasal congestion, no  fever, post nasal drip, productive cough with  green colored sputum and wheezing. Onset of symptoms was 6 weeks ago, and has been gradually worsening since that time. Treatment to date: antihistamines, cough suppressants and decongestants.  The following portions of the patient's history were reviewed and updated as appropriate: allergies, current medications, past family history, past medical history, past social history, past surgical history and problem list.  Review of Systems Pertinent items are noted in HPI.   Objective:    BP 118/72  Pulse 58  Temp 98.6 F (37 C) (Oral)  Wt 178 lb 9.6 oz (81.012 kg)  SpO2 98% General appearance: alert, cooperative and appears stated age Ears: normal TM's and external ear canals both ears and normal TM&#39;s and external ear canals both ears Nose: green discharge, mild congestion, turbinates red, swollen Throat: lips, mucosa, and tongue normal; teeth and gums normal Neck: no adenopathy, supple, symmetrical, trachea midline and thyroid not enlarged, symmetric, no tenderness/mass/nodules Lungs: diminished breath sounds bilaterally Heart: S1, S2 normal   Assessment:    bronchitis   Plan:    Suggested symptomatic OTC remedies. Nasal saline spray for congestion. Zithromax per orders. Follow up as needed.

## 2012-06-27 NOTE — Patient Instructions (Signed)

## 2012-11-24 ENCOUNTER — Telehealth: Payer: Self-pay | Admitting: Internal Medicine

## 2012-11-24 NOTE — Telephone Encounter (Signed)
Patient Information:  Caller Name: Levonne Spiller  Phone: [phone]  Patient: Benjamin Peters, Benjamin Peters  Gender: Male  DOB: 11-08-1985  Age: 27 Years  PCP: Willow Ora  Office Follow Up:  Does the office need to follow up with this patient?: Yes  Instructions For The Office: per office protocol, office prescribes antidepressants, anti anxiety and does not refer to [facility]/Psychiatry; info to office for provider review/callback.  krs/can  RN Note:  Patient states his wife left him a few weeks ago, and he is having a difficult time coping and sleeping.  States he is having trouble focusing at work.  States is seeing a therapist and getting treatment, but is ready to try medication.  Per depression protocol, disposition "call local agency now;" info to office via Epic/[facility] for staff/provider review/referral/callback.  May reach patient [phone].  krs/can  Symptoms  Reason For Call & Symptoms: insomnia, depression  Reviewed Health History In EMR: Yes  Reviewed Medications In EMR: Yes  Reviewed Allergies In EMR: Yes  Reviewed Surgeries / Procedures: Yes  Date of Onset of Symptoms: Unknown  Guideline(s) Used:  Depression  Disposition Per Guideline:   Call Local Agency Today  Reason For Disposition Reached:   Symptoms interfere with work or school  Advice Given:  N/A  Patient Will Follow Care Advice:  YES

## 2012-11-24 NOTE — Telephone Encounter (Signed)
Discussed with pt.  Scheduled appt 4.7.14

## 2012-11-24 NOTE — Telephone Encounter (Signed)
Please advise 

## 2012-11-24 NOTE — Telephone Encounter (Signed)
If severe sx or suicidal ideas: ER Otherwise schedule a OV ASAP

## 2012-11-27 ENCOUNTER — Ambulatory Visit (INDEPENDENT_AMBULATORY_CARE_PROVIDER_SITE_OTHER): Payer: BC Managed Care – PPO | Admitting: Internal Medicine

## 2012-11-27 ENCOUNTER — Encounter: Payer: Self-pay | Admitting: Internal Medicine

## 2012-11-27 VITALS — BP 122/82 | HR 81 | Temp 98.2°F | Wt 181.0 lb

## 2012-11-27 DIAGNOSIS — G4700 Insomnia, unspecified: Secondary | ICD-10-CM

## 2012-11-27 DIAGNOSIS — F411 Generalized anxiety disorder: Secondary | ICD-10-CM

## 2012-11-27 HISTORY — DX: Generalized anxiety disorder: F41.1

## 2012-11-27 MED ORDER — ZOLPIDEM TARTRATE 10 MG PO TABS: 10.0000 mg | ORAL_TABLET | Freq: Every evening | ORAL | Status: DC | PRN

## 2012-11-27 MED ORDER — ALPRAZOLAM 0.25 MG PO TABS: 0.2500 mg | ORAL_TABLET | Freq: Three times a day (TID) | ORAL | Status: DC | PRN

## 2012-11-27 MED ORDER — ESCITALOPRAM OXALATE 10 MG PO TABS: 10.0000 mg | ORAL_TABLET | Freq: Every day | ORAL | Status: DC

## 2012-11-27 NOTE — Assessment & Plan Note (Signed)
Cont ambien 

## 2012-11-27 NOTE — Progress Notes (Signed)
  Subjective:    Patient ID: Benjamin Peters, male    DOB: 1985/08/27, 27 y.o.   MRN: [id number]  HPI Here for evolution and treatment of anxiety. 3 weeks ago, his wife of 5 years left without warning, he was unable to eat for a week, did not sleep at all; after one week he started to take some steps to manage his problem: Has seen a counselor twice. His parents are involved and helping him significantly. He is experiencing panic attacks both at work and home. When they happen, he feels like he needs to go away running. He has been taking some Ambien, a leftover, with relatively good results.  Past Medical History  Diagnosis Date  . Hematuria     hx - post strep (as a teenager)  . 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 Tachycardia   . Diaphoresis     excessive   Past Surgical History  Procedure Laterality Date  . Wisdom tooth extraction     History   Social History  . Marital Status: Married    Spouse Name: N/A    Number of Children: 0  . Years of Education: N/A   Occupational History  . school Production designer, theatre/television/film   .     Social History Main Topics  . Smoking status: Never Smoker   . Smokeless tobacco: Never Used  . Alcohol Use: No  . Drug Use: No  . Sexually Active: Not on file   Other Topics Concern  . Not on file   Social History Narrative  . No narrative on file   Review of Systems  Appetite is coming back but has lost 13 pounds since the problem started. The first week, he had some suicidal thoughts but not since then.     Objective:   Physical Exam General -- alert, well-developed  .    Neurologic-- alert & oriented X3 and strength normal in all extremities. Psych-- Cognition and judgment appear intact. Alert and cooperative with normal attention span and concentration.  Anxious appearing, not depression noted.        Assessment & Plan:

## 2012-11-27 NOTE — Assessment & Plan Note (Addendum)
Severe anxiety state for 3 weeks, initially w/ depression, now depression is minimal, no suicidal ideas. Praised the fact that he reach for help--->  parents and  a Veterinary surgeon. He is also counseled today here at the office. We took about different options including SSRIs, vs benzos vs a combination of both. Eventually we agreed on: Lexapro, side effects discussed including suicidality. Ambien as needed at night. Low dose of Xanax, potential for abuse discussed, once Lexapro kicks in the need for Xanax should decrease. Today , I spent more than 25  min with the patient, >50% of the time counseling

## 2013-01-12 ENCOUNTER — Encounter: Payer: Self-pay | Admitting: Lab

## 2013-01-16 ENCOUNTER — Ambulatory Visit (INDEPENDENT_AMBULATORY_CARE_PROVIDER_SITE_OTHER): Payer: BC Managed Care – PPO | Admitting: Internal Medicine

## 2013-01-16 VITALS — BP 114/78 | HR 85 | Temp 98.4°F | Wt 173.0 lb

## 2013-01-16 DIAGNOSIS — L989 Disorder of the skin and subcutaneous tissue, unspecified: Secondary | ICD-10-CM

## 2013-01-16 DIAGNOSIS — F411 Generalized anxiety disorder: Secondary | ICD-10-CM

## 2013-01-16 MED ORDER — KETOCONAZOLE 2 % EX CREA: TOPICAL_CREAM | Freq: Two times a day (BID) | CUTANEOUS | Status: DC

## 2013-01-16 NOTE — Assessment & Plan Note (Signed)
Since the last time he was here, he took Lexapro, it helped a great deal. He d/c it a month ago and continued to feel well. At this point  does not need SSRIs, we agreed to keep him on Xanax which he takes rarely. Also recommend to continue seeing the counselor

## 2013-01-16 NOTE — Patient Instructions (Addendum)
Recommend to come back in 6 months for a physical

## 2013-01-16 NOTE — Assessment & Plan Note (Signed)
Skin lesion as described above, doubt melanoma or BCC. The patient is quite concerned about  significant sun exposure. Would like to see a dermatologist for a checkup. Plan: Ketoconazole Refer to dermatology

## 2013-01-16 NOTE — Progress Notes (Signed)
  Subjective:    Patient ID: Benjamin Peters, male    DOB: Dec 31, 1985, 27 y.o.   MRN: [id number]  HPI followup from previous visit. The patient took Lexapro for one month, it helped significantly. He continue with counseling. After a month, he felt  better and self discontinued Lexapro. Since then he continue to feel well. Also, a year ago noted a "pimple" they left arm, he drained it, he was left with dried patch of skin, for the last month, the area has enlarged, mildly itching.  Past Medical History  Diagnosis Date  . Hematuria     hx - post strep (as a teenager)  . 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 Tachycardia   . Diaphoresis     excessive  . Anxiety state, unspecified 11/27/2012    Past Surgical History  Procedure Laterality Date  . Wisdom tooth extraction      Review of Systems No depression. Still has occasional mild insomnia and takes Ambien when necessary. Also takes Xanax very rarely.    Objective:   Physical Exam  Constitutional: He appears well-developed and well-nourished. No distress.  Skin: He is not diaphoretic.     Psychiatric: He has a normal mood and affect. His behavior is normal. Judgment and thought content normal.      Assessment & Plan:

## 2013-01-18 ENCOUNTER — Encounter: Payer: Self-pay | Admitting: Internal Medicine

## 2013-02-02 ENCOUNTER — Telehealth: Payer: Self-pay | Admitting: Internal Medicine

## 2013-02-02 NOTE — Telephone Encounter (Signed)
Patient's ex-wife called asking questions about the patient's health. Spouse is listed on patient's DPR signed 07/07/12 appointing her, however they separated in March. Per Lynnell Jude, patient must call and give verbal that it is still okay to speak with her. I notified pt spouse that pt would need to call us. She understood.

## 2013-03-26 ENCOUNTER — Encounter: Payer: Self-pay | Admitting: Internal Medicine

## 2013-04-19 ENCOUNTER — Other Ambulatory Visit: Payer: Self-pay | Admitting: Internal Medicine

## 2013-04-19 ENCOUNTER — Telehealth: Payer: Self-pay | Admitting: Internal Medicine

## 2013-04-19 NOTE — Telephone Encounter (Signed)
Pt would like to see if he could get a refill on his zolpidem (AMBIEN) 10 MG tablet [[id number]]    Pharmacy: [id number] - [place], [place] - [address]

## 2013-04-19 NOTE — Telephone Encounter (Signed)
Ok to send #30, 1 RF Will get UDS at time of CPX in few months

## 2013-04-19 NOTE — Telephone Encounter (Signed)
Pt requesting refill for Ambien 10mg   Last OV-01/16/13  Last refill 11/27/12  No UDS contract.  Please advise.

## 2013-04-20 ENCOUNTER — Telehealth: Payer: Self-pay | Admitting: *Deleted

## 2013-04-20 ENCOUNTER — Other Ambulatory Visit: Payer: Self-pay | Admitting: *Deleted

## 2013-04-20 DIAGNOSIS — G4700 Insomnia, unspecified: Secondary | ICD-10-CM

## 2013-04-20 MED ORDER — ZOLPIDEM TARTRATE 10 MG PO TABS: 10.0000 mg | ORAL_TABLET | Freq: Every evening | ORAL | Status: DC | PRN

## 2013-04-20 NOTE — Telephone Encounter (Signed)
rx refilled- Ambien for Dr.Paz

## 2013-04-20 NOTE — Telephone Encounter (Signed)
Error rx  Has been fax.  [facility]

## 2013-04-20 NOTE — Telephone Encounter (Signed)
Rx fpr ambien faxed to [facility]

## 2013-05-16 ENCOUNTER — Ambulatory Visit (INDEPENDENT_AMBULATORY_CARE_PROVIDER_SITE_OTHER): Payer: BC Managed Care – PPO | Admitting: Internal Medicine

## 2013-05-16 ENCOUNTER — Ambulatory Visit (HOSPITAL_BASED_OUTPATIENT_CLINIC_OR_DEPARTMENT_OTHER)
Admission: RE | Admit: 2013-05-16 | Discharge: 2013-05-16 | Disposition: A | Discharge status: Home / Self Care | Payer: BC Managed Care – PPO | Source: Ambulatory Visit | Attending: Internal Medicine | Admitting: Internal Medicine

## 2013-05-16 ENCOUNTER — Encounter: Payer: Self-pay | Admitting: Internal Medicine

## 2013-05-16 VITALS — BP 122/84 | HR 96 | Temp 100.7°F | Wt 180.2 lb

## 2013-05-16 DIAGNOSIS — R05 Cough: Secondary | ICD-10-CM

## 2013-05-16 DIAGNOSIS — R059 Cough, unspecified: Secondary | ICD-10-CM | POA: Insufficient documentation

## 2013-05-16 DIAGNOSIS — R509 Fever, unspecified: Secondary | ICD-10-CM | POA: Insufficient documentation

## 2013-05-16 MED ORDER — HYDROCODONE-HOMATROPINE 5-1.5 MG/5ML PO SYRP: 5.0000 mL | ORAL_SOLUTION | Freq: Three times a day (TID) | ORAL | Status: DC | PRN

## 2013-05-16 MED ORDER — MOXIFLOXACIN HCL 400 MG PO TABS: 400.0000 mg | ORAL_TABLET | Freq: Every day | ORAL | Status: DC

## 2013-05-16 NOTE — Patient Instructions (Signed)
Get the XR at [facility], corner of [address] (10 minutes form here); they are open 24/7 [address]  [place], [place] [address] [phone] ----- Rest, fluids If fever or pain: tylenol or motrin OTC as needed For cough, take Mucinex DM twice a day as needed  If the cough continue: use hydrocodone, will cause drowsiness  Take the antibiotic -Avelox- as prescribed Call if no better in few days Call anytime if the symptoms are severe, you have high fever, short of breath, chest pain

## 2013-05-16 NOTE — Assessment & Plan Note (Signed)
Respiratory symptoms about 1 month, febrile today, he is nontoxic. DDX is large but includes viral versus bacterial infections, bronchitis or pneumonia. Plan: Empiric antibiotics Get a chest x-ray Prompt return if he is not improving See instructions

## 2013-05-16 NOTE — Progress Notes (Signed)
  Subjective:    Patient ID: Benjamin Peters, male    DOB: [birth year], 27 y.o.   MRN: [id number]  HPI Acute visit Patient complains of cough for 1 month, initially for 2 weeks he did produce some sputum but in the last several days the cough is dry but persistent. Increased coughing when he takes deep breaths. Today for the first time he had some fever. Has been taking Allegra OTC.  Past Medical History  Diagnosis Date  . Hematuria     hx - post strep (as a teenager)  . 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 Tachycardia   . Diaphoresis     excessive  . Anxiety state, unspecified 11/27/2012   Past Surgical History  Procedure Laterality Date  . Wisdom tooth extraction       Review of Systems Some nausea, no vomiting. + Sore throat, + sinus congestion. Some chest pain "from excessive cough" no shortness of breath at rest but some dyspnea on exertion today. 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    Objective:   Physical Exam  BP 122/84  Pulse 96  Temp(Src) 100.7 F (38.2 C)  Wt 180 lb 3.2 oz (81.738 kg)  BMI 23.78 kg/m2  SpO2 94% General -- alert, well-developed, febrile but not toxic appearing  HEENT-- Not pale. TMs normal, throat symmetric, no redness or discharge. Face symmetric, sinuses not tender to palpation. Nose slt congested.  Lungs -- normal respiratory effort, no intercostal retractions, no accessory muscle use, and normal breath sounds. No crackles appreciated today.  Heart-- normal rate, regular rhythm, no murmur.   Extremities-- no pretibial edema bilaterally  Neurologic-- alert & oriented X3. Speech normal, gait normal, strength normal in all extremities.    Psych-- Cognition and judgment appear intact. Cooperative with normal attention span and concentration. No anxious appearing , no depressed appearing.         Assessment & Plan:

## 2013-05-17 ENCOUNTER — Encounter: Payer: Self-pay | Admitting: Internal Medicine

## 2013-05-17 ENCOUNTER — Telehealth: Payer: Self-pay | Admitting: *Deleted

## 2013-05-17 NOTE — Telephone Encounter (Signed)
Message copied by Eustace Quail on Thu May 17, 2013  5:23 PM ------      Message from: Willow Ora E      Created: Wed May 16, 2013  6:23 PM       Advise patient, chest x-ray did not show pneumonia. Recommend to take antibiotics as  prescribed ------

## 2013-05-17 NOTE — Telephone Encounter (Signed)
Pt notified via tele. DJR  

## 2013-05-25 ENCOUNTER — Encounter: Payer: Self-pay | Admitting: Internal Medicine

## 2013-05-25 ENCOUNTER — Ambulatory Visit (INDEPENDENT_AMBULATORY_CARE_PROVIDER_SITE_OTHER): Payer: BC Managed Care – PPO | Admitting: Internal Medicine

## 2013-05-25 VITALS — BP 125/81 | HR 112 | Temp 100.9°F | Wt 177.0 lb

## 2013-05-25 DIAGNOSIS — R509 Fever, unspecified: Secondary | ICD-10-CM

## 2013-05-25 LAB — URINALYSIS, ROUTINE W REFLEX MICROSCOPIC
Bilirubin Urine: NEGATIVE
Glucose, UA: 100 mg/dL — AB
Ketones, ur: NEGATIVE mg/dL
Protein, ur: NEGATIVE mg/dL
Urobilinogen, UA: 1 mg/dL (ref 0.0–1.0)

## 2013-05-25 LAB — CBC WITH DIFFERENTIAL/PLATELET
Eosinophils Absolute: 0 10*3/uL (ref 0.0–0.7)
Eosinophils Relative: 0 % (ref 0–5)
HCT: 41.8 % (ref 39.0–52.0)
Lymphocytes Relative: 8 % — ABNORMAL LOW (ref 12–46)
Lymphs Abs: 1.7 10*3/uL (ref 0.7–4.0)
MCH: 31.6 pg (ref 26.0–34.0)
MCV: 88.6 fL (ref 78.0–100.0)
Monocytes Absolute: 1.6 10*3/uL — ABNORMAL HIGH (ref 0.1–1.0)
Platelets: 371 10*3/uL (ref 150–400)
RBC: 4.72 MIL/uL (ref 4.22–5.81)
RDW: 13.6 % (ref 11.5–15.5)
WBC: 21.2 10*3/uL — ABNORMAL HIGH (ref 4.0–10.5)

## 2013-05-25 LAB — COMPLETE METABOLIC PANEL WITH GFR
ALT: 33 U/L (ref 0–53)
AST: 19 U/L (ref 0–37)
Albumin: 4.2 g/dL (ref 3.5–5.2)
Alkaline Phosphatase: 77 U/L (ref 39–117)
Calcium: 9.5 mg/dL (ref 8.4–10.5)
Chloride: 98 mEq/L (ref 96–112)
Potassium: 4.1 mEq/L (ref 3.5–5.3)

## 2013-05-25 MED ORDER — DOXYCYCLINE HYCLATE 100 MG PO TABS: 100.0000 mg | ORAL_TABLET | Freq: Two times a day (BID) | ORAL | Status: DC

## 2013-05-25 NOTE — Progress Notes (Addendum)
Subjective:    Patient ID: Benjamin Peters, male    DOB: [birth year], 27 y.o.   MRN: [id number]  HPI Acute visit Was seen 05/16/2013 with cough, chest x-ray was negative, he was prescribed Avelox.. On 05/19/2013, [place] the beach, he had visible swelling around the neck  Anterior And posterior aspect. Went to the urgent care, labs were drawn, it was felt he probably had mono, they prescribe prednisone and recommend to continue with Avelox. At the time, the MD did not think he had an allergic reaction to abx, there was no  itching or rash. Swelling has significantly decreased however in the last 2 days has developed throat discomfort and some swelling as well. Results from a mononucleosis test become available yesterday and they were negative per patient.   Past Medical History  Diagnosis Date  . Hematuria     hx - post strep (as a teenager)  . 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 Tachycardia   . Diaphoresis     excessive  . Anxiety state, unspecified 11/27/2012   Past Surgical History  Procedure Laterality Date  . Wisdom tooth extraction      Review of Systems Subjective fever on and off since the last time he was here, documented fever today. Denies rash Cough is still there. He had some nausea last week but none  today. He does have some loose stools. He had headaches last week, that is resolved. Admits to arthralgias.   Admits to heterosexual unprotected sex, last episode ~ a month ago. States was  tested for STDs at the [facility] ~ 3 weeks ago ---> negative . No tick bites in years      Objective:   Physical Exam BP 125/81  Pulse 112  Temp(Src) 100.9 F (38.3 C)  Wt 177 lb (80.287 kg)  BMI 23.36 kg/m2  SpO2 96% General -- alert, well-developed, Febrile but not toxic appearing.  Neck --no LAD, no mass, external examination symmetric, ROM normal HEENT-- Not pale.  TMs slt bulge, no red or d/c, Throat symmetric ++ enlarged  tonsils with white d/c, no redness; uvula midline, slt swollen? ; no diff breathing ,no trismus .  Face symmetric, sinuses not tender to palpation. Nose not congested. Lungs -- normal respiratory effort, no intercostal retractions, no accessory muscle use, and normal breath sounds.  Heart--tachy, no murmur.  Abdomen-- Not distended, good bowel sounds,soft, non-tender. No rebound or rigidity. No mass,no organomegaly to percussion or palpation. Extremities-- no pretibial edema bilaterally  Neurologic--  alert & oriented X3. Speech normal, gait normal, strength normal in all extremities. Neack is FROM Psych-- Cognition and judgment appear intact. Cooperative with normal attention span and concentration. No anxious appearing , no depressed appearing.      Assessment & Plan:  Fever-- Not feeling well for 4-5 weeks. +Cough, + fever, recent enlargement of the neck lymph nodes per patient description, arthralgias, had some nausea which resolved, + tonsilitis He is sexually active, ( unprotected). Recent HIV, RPR, G&C at the health department negative per patient Chest x-ray few days ago negative Labs from the urgent care reviewed: CBC show a white count of 15.3, hemoglobin of 14.5, platelets 200. Mononucleosis test negative  DDX is large but includes: Viral syndrome,  retroviral infection, tick borne dz?, non infectious causes? Bacterial pharyngitis (s/p avelox) Plan: CBC CMP, Throat Cx and throat G&C HIV and HIV RNA RMSF and lyme serology UA Re asses in  1 week, ER if worse  Cover w/ doxy  Today , I spent more than 40  min with the patient, >50% of the time counseling,   reviewing the chart and labs ordered by other providers

## 2013-05-25 NOTE — Patient Instructions (Signed)
Rest, and platelets of fluids  Doxycycline as prescribed For fever: Tylenol  500 mg OTC 2 tabs a day every 8 hours as needed for pain Or Motrin 200 mg 2 tablets every 6 hours as needed for pain. Always take it with food because may cause gastritis and ulcers. If you notice nausea, stomach pain, change in the color of stools --->  Stop the medicine and let us know  Come back next week for a checkup  ER if difficulty breathing, difficulty swallowing, high fever, rash, severe headache.

## 2013-05-26 ENCOUNTER — Encounter: Payer: Self-pay | Admitting: Internal Medicine

## 2013-05-27 LAB — URINE CULTURE
Colony Count: NO GROWTH
Organism ID, Bacteria: NO GROWTH

## 2013-05-27 LAB — CULTURE, GROUP A STREP

## 2013-05-28 ENCOUNTER — Emergency Department (HOSPITAL_COMMUNITY)
Admission: EM | Admit: 2013-05-28 | Discharge: 2013-05-28 | Disposition: A | Discharge status: Home / Self Care | Payer: BC Managed Care – PPO | Attending: Emergency Medicine | Admitting: Emergency Medicine

## 2013-05-28 ENCOUNTER — Telehealth: Payer: Self-pay | Admitting: Internal Medicine

## 2013-05-28 ENCOUNTER — Encounter (HOSPITAL_COMMUNITY): Payer: Self-pay

## 2013-05-28 ENCOUNTER — Emergency Department (HOSPITAL_COMMUNITY): Payer: BC Managed Care – PPO

## 2013-05-28 DIAGNOSIS — R1084 Generalized abdominal pain: Secondary | ICD-10-CM | POA: Insufficient documentation

## 2013-05-28 DIAGNOSIS — R109 Unspecified abdominal pain: Secondary | ICD-10-CM

## 2013-05-28 DIAGNOSIS — R197 Diarrhea, unspecified: Secondary | ICD-10-CM

## 2013-05-28 DIAGNOSIS — Z87448 Personal history of other diseases of urinary system: Secondary | ICD-10-CM | POA: Insufficient documentation

## 2013-05-28 DIAGNOSIS — R0789 Other chest pain: Secondary | ICD-10-CM | POA: Insufficient documentation

## 2013-05-28 DIAGNOSIS — R059 Cough, unspecified: Secondary | ICD-10-CM

## 2013-05-28 DIAGNOSIS — H6691 Otitis media, unspecified, right ear: Secondary | ICD-10-CM

## 2013-05-28 DIAGNOSIS — Z79899 Other long term (current) drug therapy: Secondary | ICD-10-CM | POA: Insufficient documentation

## 2013-05-28 DIAGNOSIS — R509 Fever, unspecified: Secondary | ICD-10-CM

## 2013-05-28 DIAGNOSIS — M2550 Pain in unspecified joint: Secondary | ICD-10-CM | POA: Insufficient documentation

## 2013-05-28 DIAGNOSIS — R05 Cough: Secondary | ICD-10-CM | POA: Insufficient documentation

## 2013-05-28 DIAGNOSIS — H6690 Otitis media, unspecified, unspecified ear: Secondary | ICD-10-CM | POA: Insufficient documentation

## 2013-05-28 DIAGNOSIS — R0602 Shortness of breath: Secondary | ICD-10-CM | POA: Insufficient documentation

## 2013-05-28 DIAGNOSIS — F411 Generalized anxiety disorder: Secondary | ICD-10-CM | POA: Insufficient documentation

## 2013-05-28 DIAGNOSIS — R51 Headache: Secondary | ICD-10-CM | POA: Insufficient documentation

## 2013-05-28 DIAGNOSIS — B9689 Other specified bacterial agents as the cause of diseases classified elsewhere: Secondary | ICD-10-CM

## 2013-05-28 DIAGNOSIS — R630 Anorexia: Secondary | ICD-10-CM | POA: Insufficient documentation

## 2013-05-28 DIAGNOSIS — J029 Acute pharyngitis, unspecified: Secondary | ICD-10-CM | POA: Insufficient documentation

## 2013-05-28 DIAGNOSIS — Z792 Long term (current) use of antibiotics: Secondary | ICD-10-CM | POA: Insufficient documentation

## 2013-05-28 DIAGNOSIS — R112 Nausea with vomiting, unspecified: Secondary | ICD-10-CM | POA: Insufficient documentation

## 2013-05-28 LAB — CBC WITH DIFFERENTIAL/PLATELET
Basophils Absolute: 0 10*3/uL (ref 0.0–0.1)
Eosinophils Relative: 1 % (ref 0–5)
HCT: 40.4 % (ref 39.0–52.0)
Hemoglobin: 14.2 g/dL (ref 13.0–17.0)
Lymphocytes Relative: 23 % (ref 12–46)
Lymphs Abs: 2.5 10*3/uL (ref 0.7–4.0)
MCHC: 35.1 g/dL (ref 30.0–36.0)
MCV: 88.8 fL (ref 78.0–100.0)
Monocytes Absolute: 0.9 10*3/uL (ref 0.1–1.0)
Monocytes Relative: 8 % (ref 3–12)
Neutrophils Relative %: 68 % (ref 43–77)
RDW: 12.4 % (ref 11.5–15.5)
WBC: 11.2 10*3/uL — ABNORMAL HIGH (ref 4.0–10.5)

## 2013-05-28 LAB — COMPREHENSIVE METABOLIC PANEL
ALT: 76 U/L — ABNORMAL HIGH (ref 0–53)
AST: 59 U/L — ABNORMAL HIGH (ref 0–37)
BUN: 12 mg/dL (ref 6–23)
CO2: 26 mEq/L (ref 19–32)
Calcium: 9.3 mg/dL (ref 8.4–10.5)
Chloride: 97 mEq/L (ref 96–112)
Creatinine, Ser: 0.89 mg/dL (ref 0.50–1.35)
GFR calc Af Amer: >90 mL/min (ref >90)
GFR calc non Af Amer: >90 mL/min (ref >90)
Glucose, Bld: 98 mg/dL (ref 70–99)
Sodium: 137 mEq/L (ref 135–145)
Total Bilirubin: 0.4 mg/dL (ref 0.3–1.2)

## 2013-05-28 LAB — HIV-1 RNA QUANT-NO REFLEX-BLD: HIV 1 RNA Quant: <20 copies/mL (ref <20)

## 2013-05-28 LAB — LIPASE, BLOOD: Lipase: 19 U/L (ref 11–59)

## 2013-05-28 LAB — ROCKY MTN SPOTTED FVR ABS PNL(IGG+IGM)
RMSF IgG: 6.64 IV — ABNORMAL HIGH
RMSF IgM: 0.52 IV

## 2013-05-28 MED ORDER — IOHEXOL 300 MG/ML  SOLN
50.0000 mL | Freq: Once | INTRAMUSCULAR | Status: AC | PRN
  Administered 2013-05-28: 50 mL via ORAL

## 2013-05-28 MED ORDER — AMOXICILLIN-POT CLAVULANATE 875-125 MG PO TABS: 1.0000 | ORAL_TABLET | Freq: Two times a day (BID) | ORAL | Status: DC

## 2013-05-28 MED ORDER — SODIUM CHLORIDE 0.9 % IV BOLUS (SEPSIS)
1000.0000 mL | Freq: Once | INTRAVENOUS | Status: AC
  Administered 2013-05-28: 1000 mL via INTRAVENOUS

## 2013-05-28 MED ORDER — IOHEXOL 300 MG/ML  SOLN
100.0000 mL | Freq: Once | INTRAMUSCULAR | Status: AC | PRN
  Administered 2013-05-28: 100 mL via INTRAVENOUS

## 2013-05-28 NOTE — Telephone Encounter (Signed)
Patient is calling to update Dr. Drue Novel on his symptoms. States that he was vomiting on Saturday and Sunday. He also has some muscle pain. Patient is scheduled to come in on Tuesday, Oct 7th at 3:30pm to review labs from Friday.

## 2013-05-28 NOTE — Telephone Encounter (Signed)
Patient with febrile illness, white count was 21K last week (3 days after finished steroids)   and ongoing GI symptoms.  Etiology of symptoms unclear, recommend ER for eval. Likely needs repeat CBC, CT of the abdomen. I talked to pt, he is in agreement, plans to go to [facility] ER.

## 2013-05-28 NOTE — ED Provider Notes (Signed)
CSN: [id number]     Arrival date & time 05/28/13  1404 History   First MD Initiated Contact with Patient 05/28/13 1721     Chief Complaint  Patient presents with  . Abnormal Lab   (Consider location/radiation/quality/duration/timing/severity/associated sxs/prior Treatment) The history is provided by the patient and medical records. No language interpreter was used.   Benjamin Peters is a 27 y.o. male  with a hx of anxiety presents to the Emergency Department complaining of gradual, persistent, progressively worsening fever with associated cough, abdominal pain, arthralgias, nausea, vomiting, diarrhea, fever onset September 3. Patient has been seeing his primary care physician for which they have done multiple tests. Initially we're suspicious for pneumonia and patient was treated for the same with Avelox.   His chest x-ray was negative at that time. Several weeks ago patient developed lymphadenopathy and was seen in urgent care reassessed for mono was negative and there he was given oral steroids. Patient continued to get worse with intermittent fevers, diaphoresis, loss of appetite. He returned to his primary care on 05/25/2013 and many blood tests were done. Over the weekend patient developed generalized abdominal pain with nausea vomiting and diarrhea. He states his emesis is nonbloody, nonbilious. His areas watery and smells very bad.  Nothing seems to make patient's symptoms better or worse. He continues to decline.  Pt denies dysuria, hematuria, neck stiffness, back pain.  Patient specifically denies travel history outside the [place]. 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   Past Medical History  Diagnosis Date  . Hematuria     hx - post strep (as a teenager)  . 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 Tachycardia   . Diaphoresis     excessive  .  Anxiety state, unspecified 11/27/2012   Past Surgical History  Procedure Laterality Date  . Wisdom tooth extraction     Family History  Problem Relation Age of Onset  . Obesity Mother   . Hypertension Father   . Thyroid disease      "mother side", sisters   . Colon cancer Neg Hx   . Prostate cancer Neg Hx   . Coronary artery disease      GM MI, in her 10s    History  Substance Use Topics  . Smoking status: Never Smoker   . Smokeless tobacco: Never Used  . Alcohol Use: Yes     Comment: occasionally    Review of Systems  Constitutional: Positive for fever and appetite change. Negative for diaphoresis, fatigue and unexpected weight change.  HENT: Positive for sore throat. Negative for mouth sores, neck pain and neck stiffness.   Eyes: Negative for visual disturbance.  Respiratory: Positive for cough, chest tightness and shortness of breath. Negative for wheezing.   Cardiovascular: Negative for chest pain.  Gastrointestinal: Positive for vomiting, abdominal pain and diarrhea. Negative for nausea and constipation.  Endocrine: Negative for polydipsia, polyphagia and polyuria.  Genitourinary: Negative for dysuria, urgency, frequency and hematuria.  Musculoskeletal: Positive for arthralgias. Negative for back pain.  Skin: Negative for rash.  Allergic/Immunologic: Negative for immunocompromised state.  Neurological: Positive for headaches. Negative for syncope and light-headedness.  Hematological: Positive for adenopathy. Does not bruise/bleed easily.  Psychiatric/Behavioral: Negative for sleep disturbance. The patient is not nervous/anxious.     Allergies  Review of patient's  allergies indicates no known allergies.  Home Medications   Current Outpatient Rx  Name  Route  Sig  Dispense  Refill  . acetaminophen (TYLENOL) 500 MG tablet   Oral   Take 500 mg by mouth every 6 (six) hours as needed for pain.         Marland Kitchen ALPRAZolam (XANAX) 0.25 MG tablet   Oral   Take 1-2 tablets  (0.25-0.5 mg total) by mouth 3 (three) times daily as needed for anxiety.   40 tablet   0   . doxycycline (VIBRA-TABS) 100 MG tablet   Oral   Take 1 tablet (100 mg total) by mouth 2 (two) times daily.   20 tablet   0   . HYDROcodone-homatropine (HYCODAN) 5-1.5 MG/5ML syrup   Oral   Take 5 mLs by mouth every 8 (eight) hours as needed for cough.   120 mL   0   . loratadine (CLARITIN REDITABS) 10 MG dissolvable tablet   Oral   Take 10 mg by mouth as needed for allergies.         Marland Kitchen zolpidem (AMBIEN) 10 MG tablet   Oral   Take 1 tablet (10 mg total) by mouth at bedtime as needed.   30 tablet   1    BP 110/70  Pulse 73  Temp(Src) 98.4 F (36.9 C) (Oral)  Resp 16  SpO2 99% Physical Exam  Nursing note and vitals reviewed. Constitutional: He is oriented to person, place, and time. He appears well-developed and well-nourished. No distress.  Awake, alert, nontoxic appearance  HENT:  Head: Normocephalic and atraumatic.  Right Ear: External ear and ear canal normal. Tympanic membrane is bulging. A middle ear effusion is present.  Left Ear: Tympanic membrane, external ear and ear canal normal.  Nose: Nose normal. No rhinorrhea. No epistaxis. Right sinus exhibits no maxillary sinus tenderness and no frontal sinus tenderness. Left sinus exhibits no maxillary sinus tenderness and no frontal sinus tenderness.  Mouth/Throat: Uvula is midline and mucous membranes are normal. Mucous membranes are not pale, not dry and not cyanotic. No edematous. Oropharyngeal exudate, posterior oropharyngeal edema and posterior oropharyngeal erythema present. No tonsillar abscesses.  Eyes: Conjunctivae and EOM are normal. Pupils are equal, round, and reactive to light. No scleral icterus.  Neck: Normal range of motion and full passive range of motion without pain. Neck supple. No spinous process tenderness and no muscular tenderness present. No rigidity. Normal range of motion present.  Cardiovascular:  Normal rate, regular rhythm, normal heart sounds and intact distal pulses.   No murmur heard. No tachycardia  Pulmonary/Chest: Effort normal and breath sounds normal. No accessory muscle usage or stridor. Not tachypneic. No respiratory distress. He has no decreased breath sounds. He has no wheezes. He has no rhonchi. He has no rales. He exhibits no tenderness and no bony tenderness.  Course breath sounds throughout without rhonchi, rales or wheezes  Abdominal: Soft. Normal appearance and bowel sounds are normal. He exhibits no distension and no mass. There is tenderness. There is guarding. There is no rebound.  Generalized abdominal tenderness with guarding  Musculoskeletal: Normal range of motion. He exhibits no edema.  Lymphadenopathy:       Head (right side): Submandibular and tonsillar adenopathy present. No submental, no preauricular, no posterior auricular and no occipital adenopathy present.       Head (left side): Submandibular and tonsillar adenopathy present. No submental, no preauricular, no posterior auricular and no occipital adenopathy present.    He has  cervical adenopathy.       Right cervical: Superficial cervical adenopathy present. No deep cervical and no posterior cervical adenopathy present.      Left cervical: Superficial cervical adenopathy present. No posterior cervical adenopathy present.       Right: No supraclavicular adenopathy present.       Left: No supraclavicular adenopathy present.  Neurological: He is alert and oriented to person, place, and time. He exhibits normal muscle tone. Coordination normal.  Speech is clear and goal oriented Moves extremities without ataxia  Skin: Skin is warm and dry. No rash noted. He is not diaphoretic. No erythema.  Psychiatric: He has a normal mood and affect. His behavior is normal.    ED Course  Procedures (including critical care time) Labs Review Labs Reviewed  CBC WITH DIFFERENTIAL - Abnormal; Notable for the following:     WBC 11.2 (*)    All other components within normal limits  COMPREHENSIVE METABOLIC PANEL - Abnormal; Notable for the following:    AST 59 (*)    ALT 76 (*)    All other components within normal limits  LIPASE, BLOOD   Imaging Review Ct Chest W Contrast  05/28/2013   CLINICAL DATA:  Fever and cough. Lymphadenopathy. Abdominal pain. Elevated white blood count.  EXAM: CT CHEST WITH CONTRAST  TECHNIQUE: Multidetector CT imaging of the chest was performed during intravenous contrast administration.  CONTRAST:  50mL OMNIPAQUE IOHEXOL 300 MG/ML SOLN, OMNIPAQUE IOHEXOL 300 MG/ML SOLN  COMPARISON:  None.  FINDINGS: There is slight peribronchial thickening bilaterally consistent with slight bronchitis. The lungs are otherwise clear. Heart size and pulmonary vascularity are normal. No effusions. No osseous abnormality.  IMPRESSION: Slight bronchitic changes.   Electronically Signed   By: Geanie Cooley M.D.   On: 05/28/2013 19:38   Ct Abdomen Pelvis W Contrast  05/28/2013   CLINICAL DATA:  Abdominal pain. Fever. Elevated white blood count.  EXAM: CT ABDOMEN AND PELVIS WITH CONTRAST  TECHNIQUE: Multidetector CT imaging of the abdomen and pelvis was performed using the standard protocol following bolus administration of intravenous contrast.  CONTRAST:  50mL OMNIPAQUE IOHEXOL 300 MG/ML SOLN, OMNIPAQUE IOHEXOL 300 MG/ML SOLN  COMPARISON:  None.  FINDINGS: The liver, biliary tree, spleen, pancreas, adrenal glands, and bowel appear normal including the terminal ileum and appendix. Bladder appears normal. No osseous abnormality. No free air or free fluid in the abdomen.  IMPRESSION: Normal exam.   Electronically Signed   By: Geanie Cooley M.D.   On: 05/28/2013 19:39    MDM   1. Cough   2. Fever   3. Abdominal pain   4. Diarrhea   5. Bacterial pharyngitis   6. Otitis media of right ear      Benjamin Peters presents with fever, decreased appetite, sore throat, cough abdominal pain, nausea,  vomiting, diarrhea. Exam patient with enlarged, edematous, erythematous tonsils with exudate. Patient with cervical lymphadenopathy.  Patient's strep culture negative for strep pharyngitis however exam is consistent with bacterial pharyngitis.  CBC with decreased white blood cell count today, 11.2 down from 21.23 days ago. CMP unremarkable. Lipase within normal limits. CT chest and abdomen are unremarkable. No mediastinal masses, no abdominal masses, no evidence of pneumonia and no evidence of appendicitis.  Patient alert, oriented, ill-appearing but nonseptic appearing.  Labs reassuring; CT scans negative.  We'll discharge home with followup with his primary care on October 7 as initially scheduled.  Recommend spot fever and landing titers pending.  Patient has  been taking doxycycline since Friday and feels as if his throat is improving. We'll have him remain on this medication and followup with his primary care doctor tomorrow.  Discussed with Dr Rubin Payor who agrees with the plan to discharge.     It has been determined that no acute conditions requiring further emergency intervention are present at this time. The patient/guardian have been advised of the diagnosis and plan. We have discussed signs and symptoms that warrant return to the ED, such as changes or worsening in symptoms.   Vital signs are stable at discharge.   BP 110/70  Pulse 73  Temp(Src) 98.4 F (36.9 C) (Oral)  Resp 16  SpO2 99%  Patient/guardian has voiced understanding and agreed to follow-up with the PCP or specialist.      Dierdre Forth, PA-C 05/29/13 [phone]

## 2013-05-28 NOTE — ED Notes (Signed)
Patient denies any international travel or being around anyone who has .

## 2013-05-28 NOTE — ED Notes (Addendum)
Patient reports having a cough since August/2014 and has been treated for pneumonia even though CXR was normal. Patient states that he had N/V and abdominal pain this weekend. Patient also reports that he has swollen lymph glands and fever. He also has had a negative mono test. patiaent states he was told by his physician to come to the ED for a CT scan due to WBC- 21.2

## 2013-05-29 ENCOUNTER — Ambulatory Visit: Payer: BC Managed Care – PPO | Admitting: Internal Medicine

## 2013-05-29 ENCOUNTER — Telehealth: Payer: Self-pay | Admitting: Internal Medicine

## 2013-05-29 ENCOUNTER — Encounter: Payer: Self-pay | Admitting: *Deleted

## 2013-05-29 NOTE — Telephone Encounter (Signed)
Patient is calling requesting documentation for his job excusing him from work on Sept 24th-29th and Oct 3rd-7th due to his recent health complications. Patient states that he does plan to go back tomorrow and needs to pick this letter so he can take to work. Please call when ready for pick-up.

## 2013-05-29 NOTE — Telephone Encounter (Signed)
Please advise 

## 2013-05-29 NOTE — Telephone Encounter (Signed)
Spoke with the patient: Today he feels better, afebrile for the second day. He canceled the appointment  to see me tomorrow, plans to go back to work. I asked patient to come back to the office in 2 weeks for a final checkup. Will do the letter

## 2013-05-29 NOTE — ED Provider Notes (Signed)
Medical screening examination/treatment/procedure(s) were performed by non-physician practitioner and as supervising physician I was immediately available for consultation/collaboration.  Siyona Coto R. Hussien Greenblatt, MD 05/29/13 1432 

## 2013-05-29 NOTE — Telephone Encounter (Signed)
Letter placed up front for patient to pick up. 

## 2013-05-30 NOTE — Telephone Encounter (Signed)
thx

## 2013-06-01 LAB — GC/CHLAMYDIA PROBE, AMP (THROAT)

## 2013-06-04 ENCOUNTER — Telehealth: Payer: Self-pay | Admitting: *Deleted

## 2013-06-04 NOTE — Telephone Encounter (Signed)
He was feeling better, no need for further testing

## 2013-06-04 NOTE — Telephone Encounter (Signed)
Lab called Friday stating could not run the gcct throat probe due to wrong swab put in tube. Other throat culture was completed.  Please advise. DJR

## 2013-06-22 ENCOUNTER — Ambulatory Visit (INDEPENDENT_AMBULATORY_CARE_PROVIDER_SITE_OTHER): Payer: BC Managed Care – PPO | Admitting: Internal Medicine

## 2013-06-22 ENCOUNTER — Encounter: Payer: Self-pay | Admitting: Internal Medicine

## 2013-06-22 VITALS — BP 138/84 | HR 71 | Temp 98.5°F | Wt 178.0 lb

## 2013-06-22 DIAGNOSIS — R509 Fever, unspecified: Secondary | ICD-10-CM

## 2013-06-22 DIAGNOSIS — Z23 Encounter for immunization: Secondary | ICD-10-CM

## 2013-06-22 LAB — CBC WITH DIFFERENTIAL/PLATELET
Basophils Relative: 0 % (ref 0–1)
HCT: 38 % — ABNORMAL LOW (ref 39.0–52.0)
Hemoglobin: 13.3 g/dL (ref 13.0–17.0)
Lymphocytes Relative: 28 % (ref 12–46)
Lymphs Abs: 1.7 10*3/uL (ref 0.7–4.0)
MCH: 31.7 pg (ref 26.0–34.0)
MCHC: 35 g/dL (ref 30.0–36.0)
Monocytes Absolute: 0.5 10*3/uL (ref 0.1–1.0)
Monocytes Relative: 9 % (ref 3–12)
Neutro Abs: 3.4 10*3/uL (ref 1.7–7.7)
Platelets: 214 10*3/uL (ref 150–400)
WBC: 5.9 10*3/uL (ref 4.0–10.5)

## 2013-06-22 LAB — ALT: ALT: 42 U/L (ref 0–53)

## 2013-06-22 NOTE — Progress Notes (Signed)
  Subjective:    Patient ID: Benjamin Peters, male    DOB: Dec 15, 1985, 27 y.o.   MRN: [id number]  HPI F/u from previous visit Since the last time he was here, he finished doxycycline and is feeling very well, essentially back to normal. Neck lymphadenopathy: Right side slightly more firm per self exam?  Past Medical History  Diagnosis Date  . Hematuria     hx - post strep (as a teenager)  . 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 Tachycardia   . Diaphoresis     excessive  . Anxiety state, unspecified 11/27/2012   Past Surgical History  Procedure Laterality Date  . Wisdom tooth extraction     History   Social History  . Marital Status: Married    Spouse Name: N/A    Number of Children: 0  . Years of Education: N/A   Occupational History  . school Production designer, theatre/television/film   .     Social History Main Topics  . Smoking status: Never Smoker   . Smokeless tobacco: Never Used  . Alcohol Use: Yes     Comment: occasionally  . Drug Use: No  . Sexual Activity: Not on file   Other Topics Concern  . Not on file   Social History Narrative  . No narrative on file    Review of Systems No fever or chills No nausea or vomiting No CP SOB     Objective:   Physical Exam BP 138/84  Pulse 71  Temp(Src) 98.5 F (36.9 C)  Wt 178 lb (80.74 kg)  BMI 23.49 kg/m2  SpO2 99% General -- alert, well-developed, NAD.  Neck -- Lymph nodes in the jaw angle are symmetric  Lungs -- normal respiratory effort, no intercostal retractions, no accessory muscle use, and normal breath sounds.  Heart-- normal rate, regular rhythm, no murmur.  Abdomen-- Not distended, good bowel sounds,soft, non-tender. No organomegaly. Extremities-- no pretibial edema bilaterally  Psych-- Cognition and judgment appear intact. Cooperative with normal attention span and concentration. No anxious appearing , no depressed appearing.      Assessment & Plan:  Fever, Resolved. Patient doing  well. Recheck CBC and LFTs to be sure that is normalized. T dap-flu shot---> today

## 2013-06-22 NOTE — Patient Instructions (Addendum)
Get your blood work before you leave , call as needed   Schedule a yearly check at your convenience

## 2013-06-23 ENCOUNTER — Encounter: Payer: Self-pay | Admitting: Internal Medicine

## 2013-06-25 ENCOUNTER — Encounter: Payer: Self-pay | Admitting: *Deleted

## 2013-07-09 ENCOUNTER — Telehealth: Payer: Self-pay | Admitting: Internal Medicine

## 2013-07-09 MED ORDER — DULOXETINE HCL 60 MG PO CPEP: 60.0000 mg | ORAL_CAPSULE | Freq: Every day | ORAL | Status: DC

## 2013-07-09 NOTE — Telephone Encounter (Signed)
Spoke with the patient, he sees a Veterinary surgeon, during the daytime he feels well emotionally, when he goes home at night he felt   depressed and has a total lack of motivation. His counselor recommended Cymbalta. Denies suicidal ideas. In the past he took Lexapro but didn't see any major difference. We agreed to start Cymbalta 60 mg one by mouth daily, prescription sent, if he feels that needs a lower dose, he will call and will send a prescription for 30 mg to be taken the first week only F/u in 4-5 weeks

## 2013-07-09 NOTE — Telephone Encounter (Signed)
Please advise 

## 2013-07-09 NOTE — Telephone Encounter (Signed)
Patient states that his psychiatrist is advising he be put on Cymbalta. Patient is calling to see if Dr. Drue Novel will rx. Please advise.

## 2013-07-16 ENCOUNTER — Telehealth: Payer: Self-pay

## 2013-07-16 NOTE — Telephone Encounter (Signed)
Patient cancelled

## 2013-07-17 ENCOUNTER — Encounter: Payer: BC Managed Care – PPO | Admitting: Internal Medicine

## 2013-09-09 ENCOUNTER — Other Ambulatory Visit: Payer: Self-pay | Admitting: Internal Medicine

## 2013-09-10 NOTE — Telephone Encounter (Signed)
Duloxetine refilled per protocol. JG//CMA 

## 2013-09-23 ENCOUNTER — Other Ambulatory Visit: Payer: Self-pay | Admitting: Internal Medicine

## 2013-09-24 ENCOUNTER — Telehealth: Payer: Self-pay | Admitting: *Deleted

## 2013-09-24 DIAGNOSIS — G4700 Insomnia, unspecified: Secondary | ICD-10-CM

## 2013-09-24 MED ORDER — ZOLPIDEM TARTRATE 10 MG PO TABS: 10.0000 mg | ORAL_TABLET | Freq: Every evening | ORAL | Status: DC | PRN

## 2013-09-24 MED ORDER — ALPRAZOLAM 0.25 MG PO TABS: 0.2500 mg | ORAL_TABLET | Freq: Three times a day (TID) | ORAL | Status: DC | PRN

## 2013-09-24 NOTE — Telephone Encounter (Signed)
Ok RF # 40, ok Rf ambien. Advise patient, to come to the office at his convenience to sign a contract and give a urine sample, please explain patient that is a office policy .

## 2013-09-24 NOTE — Telephone Encounter (Signed)
rx refill - xanax .25mg   Last OV- 06/22/13 Last refilled - 11/27/12 #40 / 0 rf  NO UDS contract   rx refill - ambiem 10mg   Last refilled - 04/20/13 #30 / 1 rf

## 2013-09-24 NOTE — Addendum Note (Signed)
Addended by: Willow OraPAZ, Avonte Sensabaugh E on: 09/24/2013 04:59 PM   Modules accepted: Orders

## 2013-09-27 ENCOUNTER — Telehealth: Payer: Self-pay | Admitting: *Deleted

## 2013-09-27 NOTE — Telephone Encounter (Signed)
UDS Collected

## 2013-10-05 ENCOUNTER — Telehealth: Payer: Self-pay

## 2013-10-05 NOTE — Telephone Encounter (Signed)
UDS:  09/28/2013 Neg for Aplrazolam, a-Hydroxyalprazolam, Zolpidem (uses PRN) Low risk per Dr Drue NovelPaz

## 2013-11-27 ENCOUNTER — Encounter: Payer: Self-pay | Admitting: Internal Medicine

## 2013-12-12 ENCOUNTER — Other Ambulatory Visit: Payer: Self-pay | Admitting: Internal Medicine

## 2013-12-26 ENCOUNTER — Encounter: Payer: Self-pay | Admitting: Physician Assistant

## 2013-12-26 ENCOUNTER — Ambulatory Visit (INDEPENDENT_AMBULATORY_CARE_PROVIDER_SITE_OTHER): Payer: BC Managed Care – PPO | Admitting: Physician Assistant

## 2013-12-26 VITALS — BP 120/78 | HR 94 | Temp 98.4°F | Resp 16 | Ht 73.0 in | Wt 172.5 lb

## 2013-12-26 DIAGNOSIS — G4700 Insomnia, unspecified: Secondary | ICD-10-CM

## 2013-12-26 DIAGNOSIS — J029 Acute pharyngitis, unspecified: Secondary | ICD-10-CM

## 2013-12-26 DIAGNOSIS — J020 Streptococcal pharyngitis: Secondary | ICD-10-CM

## 2013-12-26 DIAGNOSIS — Z20818 Contact with and (suspected) exposure to other bacterial communicable diseases: Secondary | ICD-10-CM

## 2013-12-26 DIAGNOSIS — Z2089 Contact with and (suspected) exposure to other communicable diseases: Secondary | ICD-10-CM

## 2013-12-26 LAB — POCT RAPID STREP A (OFFICE): RAPID STREP A SCREEN: NEGATIVE

## 2013-12-26 MED ORDER — AMOXICILLIN 875 MG PO TABS: 875.0000 mg | ORAL_TABLET | Freq: Two times a day (BID) | ORAL | Status: DC

## 2013-12-26 MED ORDER — ZOLPIDEM TARTRATE 10 MG PO TABS: 10.0000 mg | ORAL_TABLET | Freq: Every evening | ORAL | Status: DC | PRN

## 2013-12-26 MED ORDER — METHYLPREDNISOLONE ACETATE 40 MG/ML IJ SUSP
40.0000 mg | Freq: Once | INTRAMUSCULAR | Status: AC
  Administered 2013-12-26: 40 mg via INTRAMUSCULAR

## 2013-12-26 NOTE — Progress Notes (Signed)
Pre visit review using our clinic review tool, if applicable. No additional management support is needed unless otherwise documented below in the visit note/SLS  

## 2013-12-26 NOTE — Patient Instructions (Signed)
Take antibiotic as directed with food.  Increase fluid intake.  Use Flonase daily.  Continue claritin.  Place a humidifier in your bedroom.  Alternate tylenol and ibuprofen for throat pain. Salt-water gargles may be beneficial. Call or return to clinic if symptoms are not improving.

## 2013-12-27 DIAGNOSIS — J020 Streptococcal pharyngitis: Secondary | ICD-10-CM | POA: Insufficient documentation

## 2013-12-27 LAB — CULTURE, GROUP A STREP

## 2013-12-27 NOTE — Progress Notes (Signed)
Patient presents to clinic today c/o 2-3 days of severe sore throat with swollen glands. Patient states he had an exposure to strep a couple of days ago. Patient denies fever, chills, malaise or fatigue. Does endorse some difficulty swallowing due to size of tonsils.  Past Medical History  Diagnosis Date  . Hematuria     hx - post strep (as a teenager)  . 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 Tachycardia   . Diaphoresis     excessive  . Anxiety state, unspecified 11/27/2012    Current Outpatient Prescriptions on File Prior to Visit  Medication Sig Dispense Refill  . ALPRAZolam (XANAX) 0.25 MG tablet Take 1-2 tablets (0.25-0.5 mg total) by mouth 3 (three) times daily as needed for anxiety.  40 tablet  0  . DULoxetine (CYMBALTA) 60 MG capsule TAKE ONE CAPSULE BY MOUTH ONCE DAILY  30 capsule  1  . loratadine (CLARITIN REDITABS) 10 MG dissolvable tablet Take 10 mg by mouth as needed for allergies.       No current facility-administered medications on file prior to visit.    No Known Allergies  Family History  Problem Relation Age of Onset  . Obesity Mother   . Hypertension Father   . Thyroid disease      "mother side", sisters   . Colon cancer Neg Hx   . Prostate cancer Neg Hx   . Coronary artery disease      GM MI, in her 2980s     History   Social History  . Marital Status: Married    Spouse Name: N/A    Number of Children: 0  . Years of Education: N/A   Occupational History  . school Production designer, theatre/television/filmadministrator   .     Social History Main Topics  . Smoking status: Never Smoker   . Smokeless tobacco: Never Used  . Alcohol Use: Yes     Comment: occasionally  . Drug Use: No  . Sexual Activity: None   Other Topics Concern  . None   Social History Narrative  . None   Review of Systems - See HPI.  All other ROS are negative.  BP 120/78  Pulse 94  Temp(Src) 98.4 F (36.9 C) (Oral)  Resp 16  Ht 6\' 1"  (1.854 m)  Wt 172 lb 8 oz (78.245 kg)  BMI  22.76 kg/m2  SpO2 98%  Physical Exam  Vitals reviewed. Constitutional: He is oriented to person, place, and time.  HENT:  Head: Normocephalic and atraumatic.  Nose: Nose normal. Right sinus exhibits no maxillary sinus tenderness and no frontal sinus tenderness. Left sinus exhibits no maxillary sinus tenderness and no frontal sinus tenderness.  Mouth/Throat: Uvula is midline and mucous membranes are normal. Oropharyngeal exudate, posterior oropharyngeal edema and posterior oropharyngeal erythema present. No tonsillar abscesses.  Eyes: Conjunctivae are normal. Pupils are equal, round, and reactive to light.  Neck: Neck supple.  Cardiovascular: Normal rate, regular rhythm, normal heart sounds and intact distal pulses.   Pulmonary/Chest: Effort normal and breath sounds normal. No respiratory distress. He has no wheezes. He has no rales. He exhibits no tenderness.  Lymphadenopathy:    He has cervical adenopathy.  Neurological: He is alert and oriented to person, place, and time.  Skin: Skin is warm and dry. No rash noted.  Psychiatric: Affect normal.   Recent Results (from the past 2160 hour(s))  POCT RAPID STREP A (OFFICE)     Status:  None   Collection Time    12/26/13  4:21 PM      Result Value Ref Range   Rapid Strep A Screen Negative  Negative    Assessment/Plan: Strep pharyngitis Rapid strep negative. We'll send for culture. Examination reveals swollen tonsils that are erythematous with white pustular exudate. Will empirically treat for strep throat given clinical findings. IM Depo-Medrol given to relieve swelling. Tylenol if fever develops. Salt water gargles. Chloraseptic spray.  Call or return to clinic if symptoms not improving.

## 2013-12-27 NOTE — Assessment & Plan Note (Signed)
Rapid strep negative. We'll send for culture. Examination reveals swollen tonsils that are erythematous with white pustular exudate. Will empirically treat for strep throat given clinical findings. IM Depo-Medrol given to relieve swelling. Tylenol if fever develops. Salt water gargles. Chloraseptic spray.  Call or return to clinic if symptoms not improving.

## 2014-01-01 ENCOUNTER — Encounter: Payer: Self-pay | Admitting: Internal Medicine

## 2014-01-01 ENCOUNTER — Ambulatory Visit (INDEPENDENT_AMBULATORY_CARE_PROVIDER_SITE_OTHER): Payer: BC Managed Care – PPO | Admitting: Internal Medicine

## 2014-01-01 VITALS — BP 118/80 | HR 80 | Temp 98.4°F | Resp 18 | Wt 174.0 lb

## 2014-01-01 DIAGNOSIS — Z113 Encounter for screening for infections with a predominantly sexual mode of transmission: Secondary | ICD-10-CM

## 2014-01-01 DIAGNOSIS — R42 Dizziness and giddiness: Secondary | ICD-10-CM

## 2014-01-01 DIAGNOSIS — F411 Generalized anxiety disorder: Secondary | ICD-10-CM

## 2014-01-01 DIAGNOSIS — G4700 Insomnia, unspecified: Secondary | ICD-10-CM

## 2014-01-01 DIAGNOSIS — J039 Acute tonsillitis, unspecified: Secondary | ICD-10-CM

## 2014-01-01 MED ORDER — ZOLPIDEM TARTRATE 10 MG PO TABS: 10.0000 mg | ORAL_TABLET | Freq: Every evening | ORAL | Status: AC | PRN

## 2014-01-01 MED ORDER — ALPRAZOLAM 0.25 MG PO TABS: 0.2500 mg | ORAL_TABLET | Freq: Three times a day (TID) | ORAL | Status: DC | PRN

## 2014-01-01 NOTE — Progress Notes (Signed)
Subjective:    Patient ID: Benjamin LeveringClinton Tep, male    DOB: 1986/07/21, 28 y.o.   MRN: [id number]  DOS:  01/01/2014 Type of  visit: Here to discuss several issues  Was treated for tonsillitis, note reviewed, he was treated empirically with antibiotics and Depo-Medrol. Rapid a strep test was negative, throat cx did not show a strep A. The patient  is doing better clinically.  He is concerned because of dizziness, he was a slightly dizzy with the onset of tonsillitis but now is getting worse, usually when he turns his head or move his eyes. On one time this was associated with nausea.  Finally, he is also concerned about STDs, 2 days ago he had discomfort at the  "prostate area" (perineum).  Also has problems refilling meds , see a/p  Review of systems: No fever chills Sore throat and throat swelling decreased No cough or chest congestion No major problems with headaches, diplopia, falls. Denies genital rash, dysuria, gross hematuria, penile discharge?     Past Medical History  Diagnosis Date  . Hematuria     hx - post strep (as a teenager)  . 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 Tachycardia   . Diaphoresis     excessive  . Anxiety state, unspecified 11/27/2012    Past Surgical History  Procedure Laterality Date  . Wisdom tooth extraction      History   Social History  . Marital Status: Married    Spouse Name: N/A    Number of Children: 0  . Years of Education: N/A   Occupational History  . school Production designer, theatre/television/filmadministrator   .     Social History Main Topics  . Smoking status: Never Smoker   . Smokeless tobacco: Never Used  . Alcohol Use: Yes     Comment: occasionally  . Drug Use: No  . Sexual Activity: Not on file   Other Topics Concern  . Not on file   Social History Narrative  . No narrative on file        Medication List       This list is accurate as of: 01/01/14  6:17 PM.  Always use your most recent med list.               ALPRAZolam 0.25 MG tablet  Commonly known as:  XANAX  Take 1-2 tablets (0.25-0.5 mg total) by mouth 3 (three) times daily as needed for anxiety.     amoxicillin 875 MG tablet  Commonly known as:  AMOXIL  Take 1 tablet (875 mg total) by mouth 2 (two) times daily.     DULoxetine 60 MG capsule  Commonly known as:  CYMBALTA  TAKE ONE CAPSULE BY MOUTH ONCE DAILY     loratadine 10 MG dissolvable tablet  Commonly known as:  CLARITIN REDITABS  Take 10 mg by mouth as needed for allergies.     zolpidem 10 MG tablet  Commonly known as:  AMBIEN  Take 1 tablet (10 mg total) by mouth at bedtime as needed.           Objective:   Physical Exam BP 118/80  Pulse 80  Temp(Src) 98.4 F (36.9 C) (Oral)  Resp 18  Wt 174 lb (78.926 kg)  SpO2 100% General -- alert, well-developed, NAD.  HEENT-- Not pale.  Throat symmetric, consistent March, no redness or discharge. TMs slt bulge but no red or d/c Lungs -- normal  respiratory effort, no intercostal retractions, no accessory muscle use, and normal breath sounds.  Heart-- normal rate, regular rhythm, no murmur.   Extremities-- no pretibial edema bilaterally  GU-- penis w/o d/c, normal scrotal contents  Neurologic--  alert & oriented X3. Speech normal, gait normal, strength normal in all extremities.  EOMI, PERLA   Psych-- Cognition and judgment appear intact. Cooperative with normal attention span and concentration. No anxious or depressed appearing.     Assessment & Plan:   Tonsillitis, improving, recommend to finish empiric antibiotics.  Dizziness, no red flags features, related to recent infection? Plan: Observation, drink a lot of fluids, declined to have Antivert  STD screening, C/o "prostate pain" apparently at the perineum, penile d/c?. No other signs of symptoms of STDs. Plan: Check a urinalysis, G&C

## 2014-01-01 NOTE — Progress Notes (Signed)
Pre-visit discussion using our clinic review tool, as applicable. No additional management support is needed unless otherwise documented below in the visit note.  

## 2014-01-01 NOTE — Patient Instructions (Addendum)
Get your labs before you leave   Finish  the antibiotics Call me if you are not back to normal in 10 days, call anytime if you feel worse or if  you have a severe   headache

## 2014-01-01 NOTE — Assessment & Plan Note (Signed)
Having  a difficult time getting Xanax and Ambien refill, new prescription provided, if a PA is needed he will let me know.

## 2014-01-02 LAB — URINALYSIS, ROUTINE W REFLEX MICROSCOPIC
BILIRUBIN URINE: NEGATIVE
HGB URINE DIPSTICK: NEGATIVE
KETONES UR: NEGATIVE
Leukocytes, UA: NEGATIVE
Nitrite: NEGATIVE
Specific Gravity, Urine: >=1.03 — AB (ref 1.000–1.030)
TOTAL PROTEIN, URINE-UPE24: NEGATIVE
UROBILINOGEN UA: 0.2 (ref 0.0–1.0)
Urine Glucose: NEGATIVE
pH: 5.5 (ref 5.0–8.0)

## 2014-01-02 LAB — GC/CHLAMYDIA PROBE AMP
CT Probe RNA: POSITIVE — AB
GC PROBE AMP APTIMA: NEGATIVE

## 2014-01-08 ENCOUNTER — Telehealth: Payer: Self-pay | Admitting: *Deleted

## 2014-01-08 DIAGNOSIS — A749 Chlamydial infection, unspecified: Secondary | ICD-10-CM

## 2014-01-08 MED ORDER — DOXYCYCLINE HYCLATE 100 MG PO TABS: 100.0000 mg | ORAL_TABLET | Freq: Two times a day (BID) | ORAL | Status: DC

## 2014-01-08 NOTE — Telephone Encounter (Signed)
Spoke with patient and advised of lab results. F/U appt scheduled for 01/22/14 at 4pm

## 2014-01-08 NOTE — Progress Notes (Signed)
Called again, unable to leave a message due to the mailbox being full.

## 2014-01-08 NOTE — Telephone Encounter (Signed)
Attempted to call patient to advise of lab results, mail box is full. Will try again later.

## 2014-01-08 NOTE — Telephone Encounter (Signed)
Message copied by Baldwin JamaicaJOHNSON, Chadrick Sprinkle G on Tue Jan 08, 2014 10:18 AM ------      Message from: Wanda PlumpPAZ, JOSE E      Created: Tue Jan 08, 2014  9:21 AM       Advise patient,      Chlamydia test +, gonorrhea negative.      Recommend doxycycline 100 mg twice a day for one week, call prescription, needs to avoid excessive sun exposure while taking this antibiotic.      Office visit in 2-3  weeks to recheck. ------

## 2014-01-29 ENCOUNTER — Ambulatory Visit (INDEPENDENT_AMBULATORY_CARE_PROVIDER_SITE_OTHER): Payer: BC Managed Care – PPO | Admitting: Internal Medicine

## 2014-01-29 ENCOUNTER — Encounter: Payer: Self-pay | Admitting: Internal Medicine

## 2014-01-29 VITALS — BP 132/87 | HR 95 | Temp 98.3°F | Wt 178.0 lb

## 2014-01-29 DIAGNOSIS — N342 Other urethritis: Secondary | ICD-10-CM

## 2014-01-29 NOTE — Progress Notes (Signed)
   Subjective:    Patient ID: Benjamin Peters, male    DOB: [birth year], 28 y.o.   MRN: [id number]  DOS:  01/29/2014 Type of  Visit: Followup History: Since the last office visit, he is doing better. Was diagnosed with chlamydia, took antibiotics, now asymptomatic. Was seen with dizziness, symptoms resolve.  ROS Denies any dysuria, penile discharge, difficulty urinating  Past Medical History  Diagnosis Date  . Hematuria     hx - post strep (as a teenager)  . 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 Tachycardia   . Diaphoresis     excessive  . Anxiety state, unspecified 11/27/2012    Past Surgical History  Procedure Laterality Date  . Wisdom tooth extraction      History   Social History  . Marital Status: Married    Spouse Name: N/A    Number of Children: 0  . Years of Education: N/A   Occupational History  . school Production designer, theatre/television/film   .     Social History Main Topics  . Smoking status: Never Smoker   . Smokeless tobacco: Never Used  . Alcohol Use: Yes     Comment: occasionally  . Drug Use: No  . Sexual Activity: Not on file   Other Topics Concern  . Not on file   Social History Narrative   Divorced, has a G-friend         Medication List       This list is accurate as of: 01/29/14  8:02 PM.  Always use your most recent med list.               ALPRAZolam 0.25 MG tablet  Commonly known as:  XANAX  Take 1-2 tablets (0.25-0.5 mg total) by mouth 3 (three) times daily as needed for anxiety.     amoxicillin 875 MG tablet  Commonly known as:  AMOXIL  Take 1 tablet (875 mg total) by mouth 2 (two) times daily.     doxycycline 100 MG tablet  Commonly known as:  VIBRA-TABS  Take 1 tablet (100 mg total) by mouth 2 (two) times daily.     DULoxetine 60 MG capsule  Commonly known as:  CYMBALTA  TAKE ONE CAPSULE BY MOUTH ONCE DAILY     loratadine 10 MG dissolvable tablet  Commonly known as:  CLARITIN REDITABS  Take 10 mg by mouth as  needed for allergies.     zolpidem 10 MG tablet  Commonly known as:  AMBIEN  Take 1 tablet (10 mg total) by mouth at bedtime as needed.           Objective:   Physical Exam BP 132/87  Pulse 95  Temp(Src) 98.3 F (36.8 C)  Wt 178 lb (80.74 kg)  SpO2 99%  General -- alert, well-developed, NAD.  Neurologic--  alert & oriented X3. Speech normal, gait appropriate for age, strength symmetric and appropriate for age.  Psych-- Cognition and judgment appear intact. Cooperative with normal attention span and concentration. No anxious or depressed appearing.       Assessment & Plan:

## 2014-01-29 NOTE — Progress Notes (Signed)
Pre visit review using our clinic review tool, if applicable. No additional management support is needed unless otherwise documented below in the visit note. 

## 2014-01-29 NOTE — Patient Instructions (Signed)
Get your blood work and urine test  before you leave   Schedule a physical within the next few months    Safe Sex Safe sex is about reducing the risk of giving or getting a sexually transmitted disease (STD). STDs are spread through sexual contact involving the genitals, mouth, or rectum. Some STDS can be cured and others cannot. Safe sex can also prevent unintended pregnancies.  SAFE SEX PRACTICES  Limit your sexual activity to only one partner who is only having sex with you.  Talk to your partner about their past partners, past STDs, and drug use.  Use a condom every time you have sexual intercourse. This includes vaginal, oral, and anal sexual activity. Both females and males should wear condoms during oral sex. Only use latex or polyurethane condoms and water-based lubricants. Petroleum-based lubricants or oils used to lubricate a condom will weaken the condom and increase the chance that it will break. The condom should be in place from the beginning to the end of sexual activity. Wearing a condom reduces, but does not completely eliminate, your risk of getting or giving a STD. STDs can be spread by contact with skin of surrounding areas.  Get vaccinated for hepatitis B and HPV.  Avoid alcohol and recreational drugs which can affect your judgement. You may forget to use a condom or participate in high-risk sex.  For females, avoid douching after sexual intercourse. Douching can spread an infection farther into the reproductive tract.  Check your body for signs of sores, blisters, rashes, or unusual discharge. See your caregiver if you notice any of these signs.  Avoid sexual contact if you have symptoms of an infection or are being treated for an STD. If you or your partner has herpes, avoid sexual contact when blisters are present. Use condoms at all other times.  See your caregiver for regular screenings, examinations, and tests for STDs. Before having sex with a new partner, each of  you should be screened for STDs and talk about the results with your partner. BENEFITS OF SAFE SEX   There is less of a chance of getting or giving an STD.  You can prevent unwanted or unintended pregnancies.  By discussing safer sex concerns with your partner, you may increase feelings of intimacy, comfort, trust, and honesty between the both of you. Document Released: 09/16/2004 Document Revised: 05/03/2012 Document Reviewed: 01/31/2012 [facility] Patient Information 2014 [place], [place].

## 2014-01-29 NOTE — Assessment & Plan Note (Signed)
Recently diagnosed with chlamydia urethritis, status post doxycycline. His current girlfriend also had chlamydia and  was recently treated. Plan: Urinalyses, HIV and RPR. Safe sex discussed

## 2014-01-30 LAB — GC/CHLAMYDIA PROBE AMP, URINE
CHLAMYDIA, SWAB/URINE, PCR: NEGATIVE
GC PROBE AMP, URINE: NEGATIVE

## 2014-01-30 LAB — RPR

## 2014-01-30 LAB — HIV ANTIBODY (ROUTINE TESTING W REFLEX): HIV 1&2 Ab, 4th Generation: NONREACTIVE

## 2014-03-18 ENCOUNTER — Other Ambulatory Visit: Payer: Self-pay | Admitting: Physician Assistant

## 2014-03-18 NOTE — Telephone Encounter (Signed)
Denied; pt needs OV for ABX medication/SLS

## 2014-05-03 IMAGING — CT CT ABD-PELV W/ CM
1 of 2 series · 16 of 29 positions shown, 20 images · IV contrast (OMNIPAQUE 300)
Comparison: None.

CLINICAL DATA: Abdominal pain. Fever. Elevated white blood count.

EXAM:
CT ABDOMEN AND PELVIS WITH CONTRAST
TECHNIQUE: Multidetector CT imaging of the abdomen and pelvis was performed
using the standard protocol following bolus administration of
intravenous contrast.
CONTRAST:  50mL OMNIPAQUE IOHEXOL 300 MG/ML SOLN, 100mL OMNIPAQUE
IOHEXOL 300 MG/ML SOLN

[Series 2: c/a/p with · axial · 0.74mm/px · z∈[-566,+14]mm · 16 of 132 slices shown, 20 images]
[im 8/132  mediastinal]
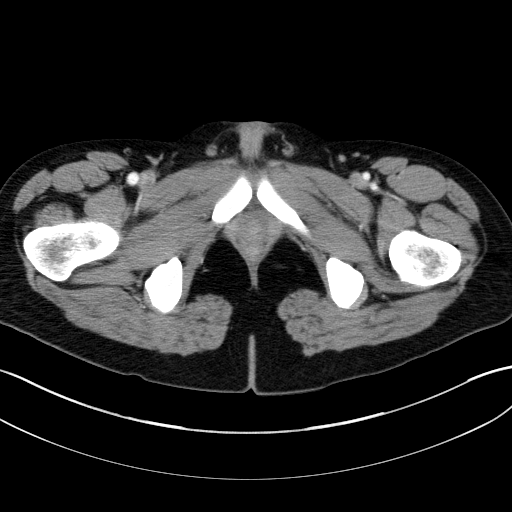
[im 8/132  lung]
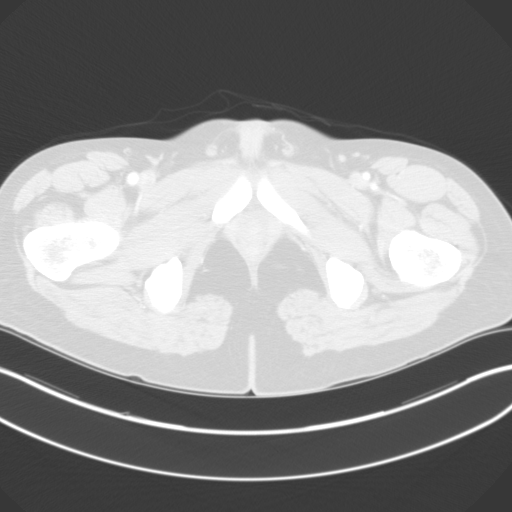
[im 15/132  lung]
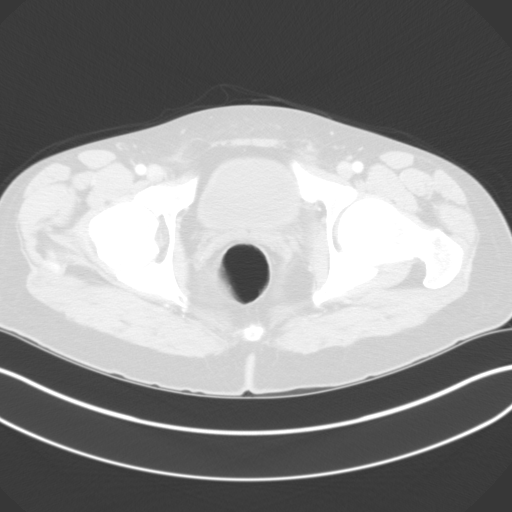
[im 22/132  lung]
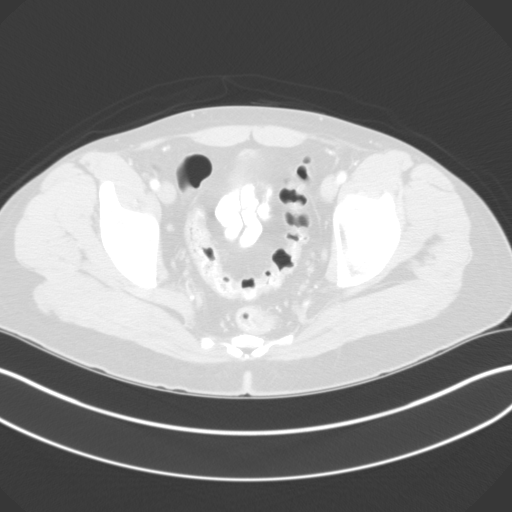
[im 30/132  lung]
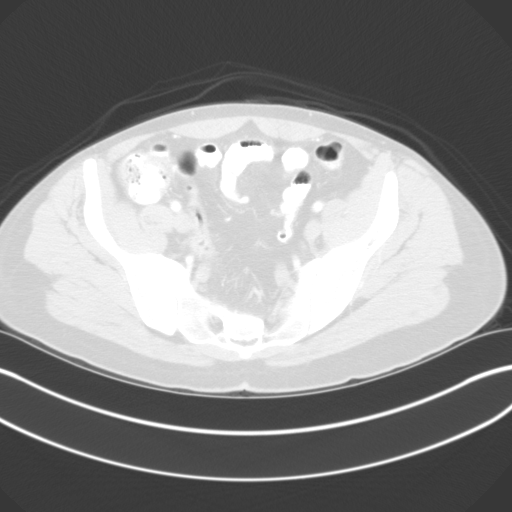
[im 44/132  mediastinal]
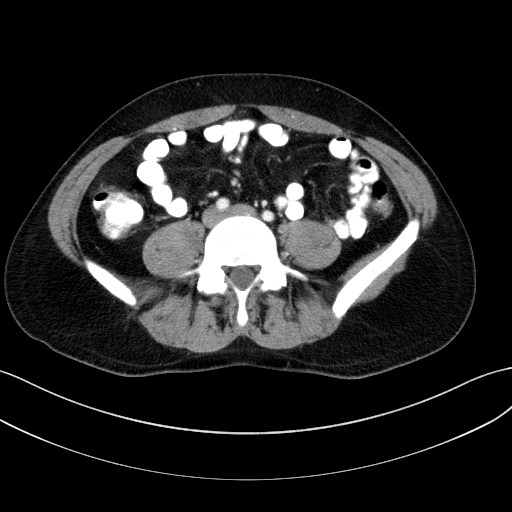
[im 44/132  lung]
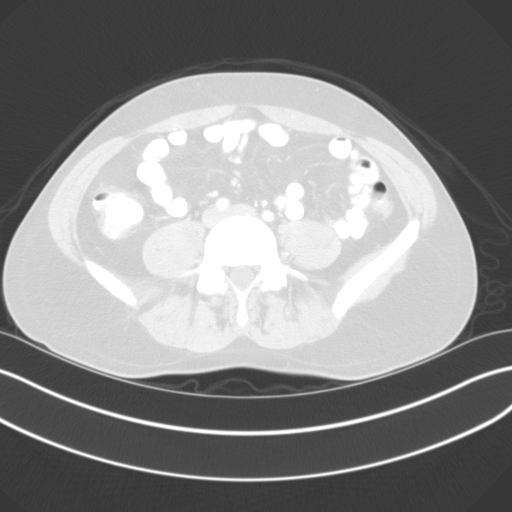
[im 51/132  lung]
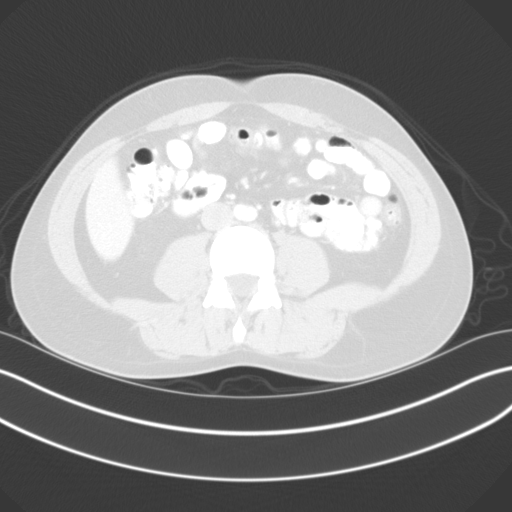
[im 59/132  lung]
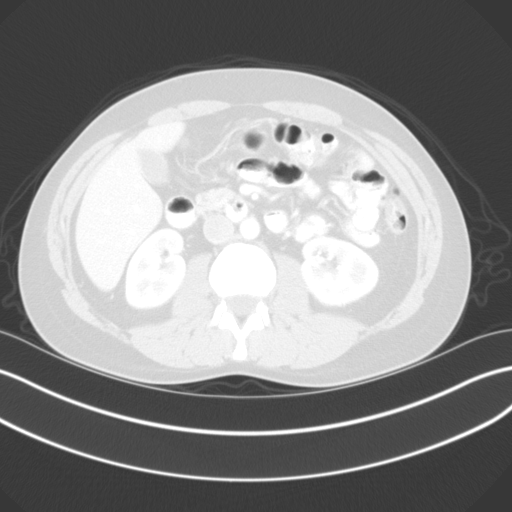
[im 65/132  lung]
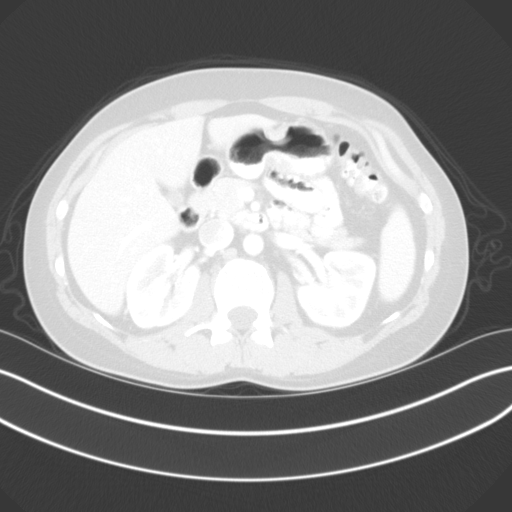
[im 66/132  mediastinal]
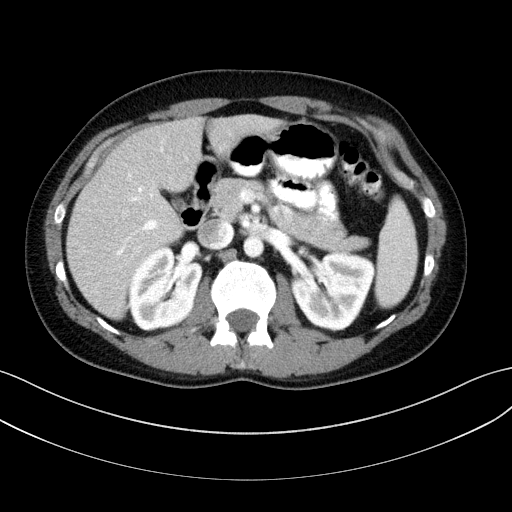
[im 66/132  lung]
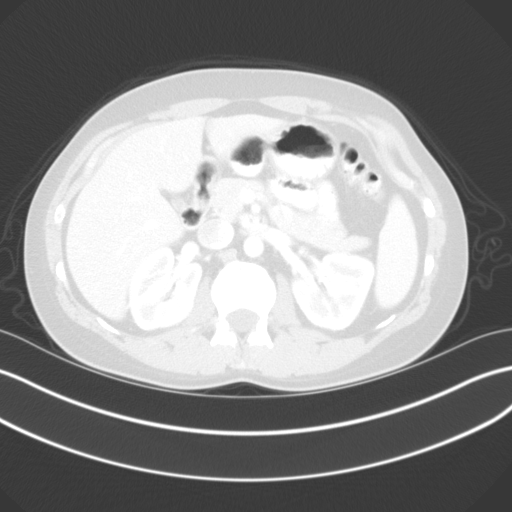
[im 73/132  lung]
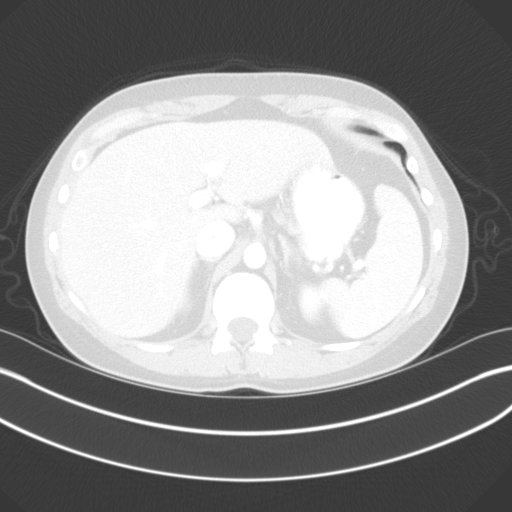
[im 81/132  lung]
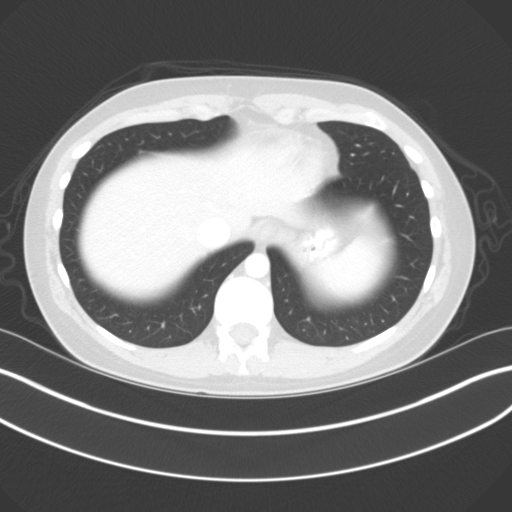
[im 88/132  lung]
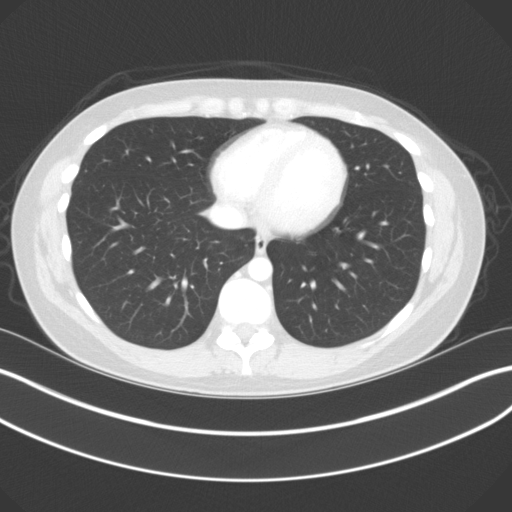
[im 102/132  mediastinal]
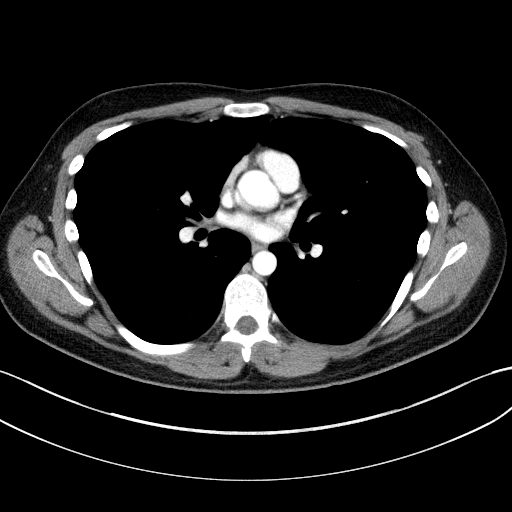
[im 102/132  lung]
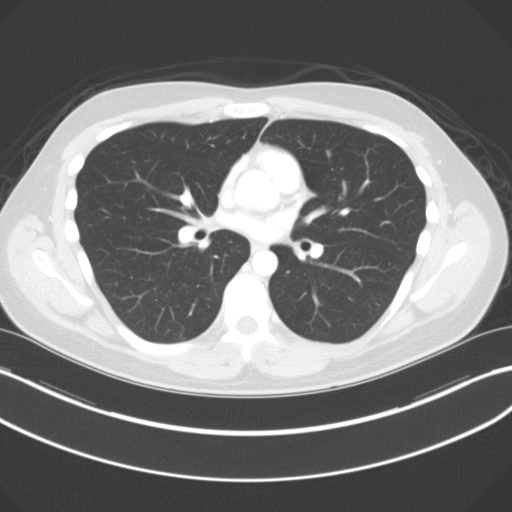
[im 110/132  lung]
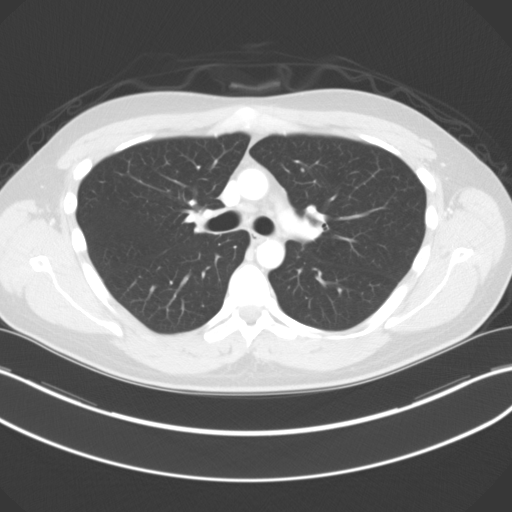
[im 117/132  lung]
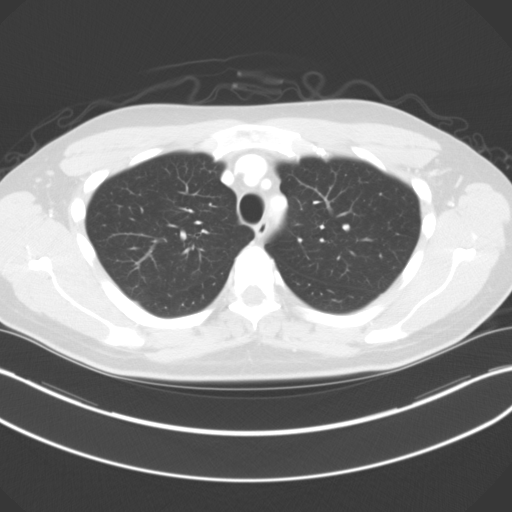
[im 124/132  lung]
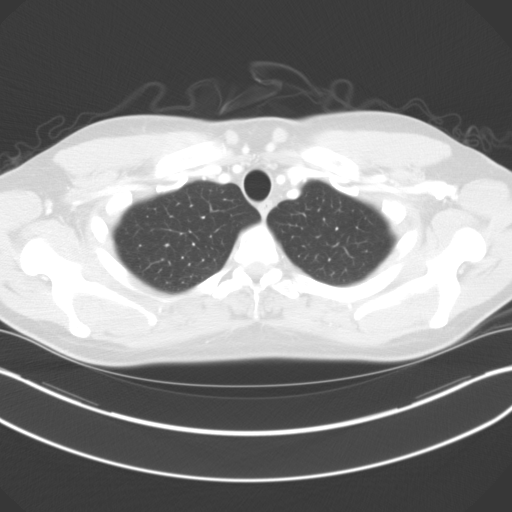

[16 of 29 positions shown; findings below may reference images not displayed]

FINDINGS: The liver, biliary tree, spleen, pancreas, adrenal glands, and bowel
appear normal including the terminal ileum and appendix. Bladder
appears normal. No osseous abnormality. No free air or free fluid in
the abdomen.
IMPRESSION: Normal exam.

## 2017-03-09 ENCOUNTER — Encounter: Attending: Urology

## 2017-03-30 ENCOUNTER — Ambulatory Visit: Admit: 2017-03-30 | Discharge: 2017-03-30 | Payer: PRIVATE HEALTH INSURANCE | Attending: Urology

## 2017-03-30 DIAGNOSIS — Z3009 Encounter for other general counseling and advice on contraception: Secondary | ICD-10-CM

## 2017-03-30 MED ORDER — OXYCODONE-ACETAMINOPHEN 5 MG-325 MG TAB
5-325 mg | ORAL_TABLET | ORAL | 0 refills | Status: AC | PRN
Start: 2017-03-30 — End: ?

## 2017-03-30 MED ORDER — DIAZEPAM 10 MG TAB
10 mg | ORAL_TABLET | ORAL | 0 refills | Status: AC
Start: 2017-03-30 — End: ?

## 2017-03-30 NOTE — Progress Notes (Signed)
Chief Complaint   Patient presents with   ??? Referral / Consult     Vasectomy       Vasectomy Consult    Brent Ayala is a 31 y.o. male here for pre-vasectomy consultation.  He was referred by Internet.     He has been married for 6 months.  He has 0 children.    He reports normal erections  He reports normal ejaculation  He has no history of scrotal swelling or pain      History reviewed. No pertinent past medical history.      History reviewed. No pertinent surgical history.      Current Outpatient Prescriptions   Medication Sig Dispense Refill   ??? zolpidem (AMBIEN) 10 mg tablet Take 10 mg by mouth.     ??? valACYclovir (VALTREX) 1 gram tablet Take 1,000 mg by mouth.           No Known Allergies      Social History     Social History   ??? Marital status: MARRIED     Spouse name: N/A   ??? Number of children: N/A   ??? Years of education: N/A     Occupational History   ??? Not on file.     Social History Main Topics   ??? Smoking status: Never Smoker   ??? Smokeless tobacco: Never Used   ??? Alcohol use Yes      Comment: socially   ??? Drug use: No   ??? Sexual activity: Not on file     Other Topics Concern   ??? Not on file     Social History Narrative   ??? No narrative on file         History reviewed. No pertinent family history.      REVIEW OF SYSTEMS    Constitutional: Fever: No  Skin: Rash: No  HEENT: Hearing difficulty: No  Eyes: Blurred vision: No  Cardiovascular: Chest pain: No  Respiratory: Shortness of breath: No  Gastrointestinal: Nausea/vomiting: No  Musculoskeletal: Back pain: No  Neurological: Weakness: No  Psychological: Memory loss: No  Comments/additional findings:        PHYSICAL EXAM    Visit Vitals   ??? BP 118/78   ??? Ht 6\' 2"  (1.88 m)   ??? Wt 180 lb (81.6 kg)   ??? BMI 23.11 kg/m2     Constitutional: WDWN, Pleasant and appropriate affect, No acute distress.    CV:  No peripheral swelling noted  Respiratory: No respiratory distress or difficulties  Abdomen:  No abdominal masses or tenderness.  No CVA tenderness. No  hernias  GU Male:     Scrotum:   normal  Vas:  easily  palpable  Epididymides: normal  Testes:   normal  Urethral Meatus:   normal  Penis:  normal  Skin: No jaundice.    Neuro/Psych:  Alert and oriented x 3. Affect appropriate.   Lymphatic:   No enlarged inguinal lymph nodes.           ASSESSMENT:  Encounter Diagnosis   Name Primary?   ??? Vasectomy evaluation Yes         He was given the consent form, pre-vasectomy instruction sheet, and vasectomy booklet.   I extensively reviewed with him the likely postoperative recuperative period as well as the need to continue to use contraception until he is notified by us of his sterility.   He will have a semen analysis after 2 months.   He understands  the potential side effects of anesthesia, bleeding, scrotal hematoma, wound infection, epididymo-orchitis, epididymal congestion, chronic testicular pain requiring further surgery, sperm granuloma, antisperm antibodies, early recanalization, spontaneous recanalization with pregnancy after demonstration of azoospermia and the possible association with prostate cancer.   He is aware of alternatives to vasectomy.  He has given this careful consideration and wishes to proceed with a vasectomy.  He is aware he will need transportation following the procedure. We will schedule his vasectomy per his request.    Rx given for Percocet and Valium             VBrugh

## 2017-08-05 ENCOUNTER — Ambulatory Visit: Admit: 2017-08-05 | Discharge: 2017-08-05 | Payer: PRIVATE HEALTH INSURANCE | Attending: Urology

## 2017-08-05 DIAGNOSIS — Z302 Encounter for sterilization: Secondary | ICD-10-CM

## 2017-08-05 NOTE — Progress Notes (Signed)
No Scalpel Vasectomy Procedure Note    Indications: 31 y.o. male desiring permanent sterilization    Pre-operative Diagnosis: Undesired fertility    Post-operative Diagnosis: Undesired fertility    Anesthesia: Lidocaine 2% without epinephrine    Procedure Details     The risks and benefits of the procedure were discussed at the pre-procedure consultation, and written, informed consent obtained.    Premedicated  Valium 10 mgm po 30 minutes prior to procedure.    The scrotum was palpated with both testes normal in size and position, no masses palpitated. The midline scrotum was prepped with alcohol and using the Jet Injector the skin and vas deferens were anesthesized.  The scrotum was cleansed with warm betadine and draped in the usual sterile manner.     A vasal sheath block was performed on both the left and right vas.  After adequate anesthesia was established, a small perforation was made in the midline scrotal skin and the right vas was isolated with the ring forceps, dissected free and delivered through the skin perforation.  The right vas was divided, approximately 1.5 cm portion removed, and each end of the vas was cauterized and hemoclips applied.  The ends of the vas were replaced in the scrotum through the puncture site.  The left vas was then isolated, divided, cauterized and ligated in a similar fashion, through the same incision.  Midportions removed were not sent to pathology to confirm.    Any bleeding was controlled with electrocautery or suture ligation.  The puncture site was dry and temporarily clamped for hemostasis when the procedure was completed.  Dry sterile dressings were applied to scrotal area and jock strap applied.      Condition:  Stable    Complications:  none.    Plan:  1. Continue contraception until negative sperm analysis. Semen count between 6 to 12 weeks  2. Warning signs of infection were reviewed.   3. Patient is taken home by wife with written home care instructions.   Bedrest X 48 hrs, Ice pack every 3 hours for 48 hrs.    Call the clinic if excessive pain, bleeding or swelling.  No lifting >10 lbs or sexual activity for 1 week.   4. Recommended that the patient use Percocet as needed for pain.       VBrugh

## 2017-08-08 ENCOUNTER — Encounter: Attending: Urology

## 2017-10-10 ENCOUNTER — Encounter

## 2017-10-11 ENCOUNTER — Institutional Professional Consult (permissible substitution): Admit: 2017-10-11 | Discharge: 2017-10-11

## 2017-10-11 DIAGNOSIS — Z9852 Vasectomy status: Secondary | ICD-10-CM

## 2017-10-11 NOTE — Progress Notes (Signed)
Phoned the patient at [phone] (home)  and LM with my direct number and requested that he phone me back. He will need to be informed that his SA was negative, he may d/c any contraceptive devices and no follow up is needed.

## 2017-10-11 NOTE — Progress Notes (Signed)
Phoned patient at [phone] (home)  and informed them of his negative SA, to d/c any contraceptive devices and no follow up is needed, he agreed in understanding, thanked me for the call and it was ended.

## 2017-10-11 NOTE — Progress Notes (Signed)
Post Vas SA 1  no sperm present in pellet.  Patient may discontinue contraception - yes  F/U for semen analysis in 1 month - no    VBrugh

## 2022-11-04 NOTE — Progress Notes (Signed)
 Benjamin Peters 59yrs Male 11/04/2022  Benjamin Peters is a 37 y.o., Male who presents with the below chief complaint:   Chief Complaint  Patient presents with  . UTI    Patient states he has had burning with urination since Tuesday. He did have a UTI years ago and it feels the same.     HPI / Interval History  Dysuria and frequency for 2-3 days.  Denies fever.  Hx of pyelo 20 years ago.  Denies STI concern.    Past Medical History:  Diagnosis Date  . Strep throat     The patient has a current medication list which includes the following prescription(s): sulfamethoxazole-trimethoprim (BACTRIM DS,SEPTRA DS) 800-160 mg Oral Tablet, valACYclovir (VALTREX) 1 gram Oral Tablet, zolpidem  (AMBIEN ) 10 mg Oral Tablet, DULoxetine  (CYMBALTA ) 60 mg Oral Capsule, Delayed Release(E.C.), and sildenafiL, pulm.hypertension, (REVATIO) 20 mg Oral Tablet.  Problem list Problem List Items Addressed This Visit   None Visit Diagnoses     Lower urinary tract infectious disease    -  Primary   Relevant Medications   sulfamethoxazole-trimethoprim (BACTRIM DS,SEPTRA DS) 800-160 mg Oral Tablet   Other Relevant Orders   CULTURE, URINE   CT, NG, TRICH BY NAA ([facility])   Dysuria       Relevant Orders   URINE DIPSTICK (URINE ANALYZER) CLINIC PERFORMED W/CPT (81003) (Completed)       Review of Systems  Constitutional:  Negative for chills and fever.  Gastrointestinal:  Negative for abdominal pain, nausea and vomiting.  Genitourinary:  Positive for dysuria, frequency and urgency. Negative for flank pain and hematuria.    During the visit we documented the patient's vitals and reviewed the following patient information: medical and family history, allergies, current medications, and problem list.  Visit Vitals: BP 124/87 (BP Site: Left arm, Patient Position: Sitting, Cuff Size: Adult)   Pulse 67   Temp 97.8 F (36.6 C) (Temporal)   Resp 14   Ht 6' 2 (1.88 m)   Wt 185 lb (83.9 kg)   SpO2 96%   BMI  23.75 kg/m   Physical Exam Vitals and nursing note reviewed.  Constitutional:      Appearance: Normal appearance.  Abdominal:     General: Abdomen is flat.     Tenderness: There is no right CVA tenderness or left CVA tenderness.  Neurological:     Mental Status: He is alert.      Orders and Associated Dx: [name] was seen today for uti.  Diagnoses and all orders for this visit:  Lower urinary tract infectious disease -     sulfamethoxazole-trimethoprim (BACTRIM DS,SEPTRA DS) 800-160 mg Oral Tablet; Take 1 Tablet by mouth twice a day for 7 days. -     CULTURE, URINE; Future -     CT, NG, TRICH BY NAA ([facility]); Future  Dysuria -     URINE DIPSTICK (URINE ANALYZER) CLINIC PERFORMED W/CPT (18996)    Appropriate discharge instructions/material provided to patient/family.   Follow up if symptoms persist     Alan Pouch, FNP-C

## 2023-11-14 NOTE — Progress Notes (Signed)
.   Explained to patient.  Blood sample collected from right median cubital , via butterfly, tubes drawn:SST x 2 . Blood sent to lab Yes.   Patient tolerated procedure well, without complications. Pressure band-aid applied.

## 2023-11-14 NOTE — Progress Notes (Signed)
 Benjamin Peters 67yrs Male 11/14/2023  Benjamin Peters is a 38 y.o., Male who presents with the below chief complaint:   Chief Complaint  Patient presents with  . STD Exposure    Partner went in for a test yesterday and tested positive for HIV.     HPI / Interval History  As above.  Partner is his wife.  Apparently it was a very faint positive on rapid and is awaiting confirmatory testing. Partner had unprotected intercourse ~ 5 months ago.  Patient has only had one partner.  Last sexual contact with partner was 5 days ago.  Vaginal intercourse.  No MMSC.    Past Medical History[1]  The patient has a current medication list which includes the following prescription(s): busPIRone (BUSPAR) 5 mg Oral Tablet, citalopram (CeleXA) 20 mg Oral Tablet, DULoxetine  (CYMBALTA ) 60 mg Oral Capsule, Delayed Release(E.C.), sildenafiL, pulm.hypertension, (REVATIO) 20 mg Oral Tablet, valACYclovir (VALTREX) 1 gram Oral Tablet, and zolpidem  (AMBIEN ) 10 mg Oral Tablet.  Problem list Problem List Items Addressed This Visit   None Visit Diagnoses       Routine screening for STI (sexually transmitted infection)    -  Primary   Relevant Orders   CT, NG, TRICH BY NAA ([facility])   HIV AB/AG COMBINED ASSAY   T PALLIDUM SCREENING CASCADE ([facility])   HEPATITIS C ANTIBODY     At high risk for exposure to HIV           Review of Systems  Constitutional:  Negative for chills, fever, malaise/fatigue and weight loss.  Cardiovascular: Negative.   Genitourinary: Negative.   Musculoskeletal: Negative.     During the visit we documented the patient's vitals and reviewed the following patient information: medical and family history, allergies, current medications, and problem list.  Visit Vitals: BP 133/87   Pulse 61   Temp 98 F (36.7 C)   Resp 18   Ht 6' 2 (1.88 m)   Wt 190 lb (86.2 kg)   SpO2 96%   BMI 24.39 kg/m   Physical Exam Vitals and nursing note reviewed.  Constitutional:      Appearance: Normal  appearance.  Neurological:     Mental Status: He is alert.      Orders and Associated Dx: Benjamin Peters was seen today for std exposure.  Diagnoses and all orders for this visit:  Routine screening for STI (sexually transmitted infection) -     CT, NG, TRICH BY NAA ([facility]); Future -     HIV AB/AG COMBINED ASSAY; Future -     T PALLIDUM SCREENING CASCADE ([facility]); Future -     HEPATITIS C ANTIBODY; Future -     CT, NG, TRICH BY NAA ([facility]) -     HEPATITIS C ANTIBODY -     T PALLIDUM SCREENING CASCADE ([facility]) -     HIV AB/AG COMBINED ASSAY  At high risk for exposure to HIV  Discussed case with ID at [facility].  Since patient is out of 72 hour window, advised to wait for partner's confirmatory testing prior to initiating PEP.  Pt agrees.    Appropriate discharge instructions/material provided to patient/family.   Follow up if symptoms persist     Benjamin Pouch, FNP-C       [1] Past Medical History: Diagnosis Date  . Strep throat

## 2024-06-21 ENCOUNTER — Other Ambulatory Visit: Payer: Self-pay

## 2024-06-21 ENCOUNTER — Observation Stay
Admission: EM | Admit: 2024-06-21 | Discharge: 2024-06-23 | Disposition: A | Discharge status: Home / Self Care | Attending: Surgery | Admitting: Surgery

## 2024-06-21 ENCOUNTER — Emergency Department

## 2024-06-21 DIAGNOSIS — F1090 Alcohol use, unspecified, uncomplicated: Secondary | ICD-10-CM | POA: Insufficient documentation

## 2024-06-21 DIAGNOSIS — K3532 Acute appendicitis with perforation and localized peritonitis, without abscess: Principal | ICD-10-CM | POA: Insufficient documentation

## 2024-06-21 DIAGNOSIS — K3580 Unspecified acute appendicitis: Secondary | ICD-10-CM | POA: Diagnosis not present

## 2024-06-21 LAB — COMPREHENSIVE METABOLIC PANEL WITH GFR
ALT: 47 U/L — ABNORMAL HIGH (ref 0–44)
AST: 29 U/L (ref 15–41)
Albumin: 4.4 g/dL (ref 3.5–5.0)
Alkaline Phosphatase: 65 U/L (ref 38–126)
Anion gap: 14 (ref 5–15)
BUN: 13 mg/dL (ref 6–20)
CO2: 23 mmol/L (ref 22–32)
Calcium: 9 mg/dL (ref 8.9–10.3)
Chloride: 100 mmol/L (ref 98–111)
Creatinine, Ser: 0.84 mg/dL (ref 0.61–1.24)
GFR, Estimated: >60 mL/min (ref >60)
Glucose, Bld: 153 mg/dL — ABNORMAL HIGH (ref 70–99)
Potassium: 3.4 mmol/L — ABNORMAL LOW (ref 3.5–5.1)
Sodium: 137 mmol/L (ref 135–145)
Total Bilirubin: 1.1 mg/dL (ref 0.0–1.2)
Total Protein: 7.7 g/dL (ref 6.5–8.1)

## 2024-06-21 LAB — URINALYSIS, ROUTINE W REFLEX MICROSCOPIC
Bacteria, UA: NONE SEEN
Bilirubin Urine: NEGATIVE
Glucose, UA: >=500 mg/dL — AB
Hgb urine dipstick: NEGATIVE
Ketones, ur: 20 mg/dL — AB
Leukocytes,Ua: NEGATIVE
Nitrite: NEGATIVE
Protein, ur: NEGATIVE mg/dL
RBC / HPF: 0 RBC/hpf (ref 0–5)
Specific Gravity, Urine: 1.018 (ref 1.005–1.030)
pH: 5 (ref 5.0–8.0)

## 2024-06-21 LAB — CBC
HCT: 43 % (ref 39.0–52.0)
Hemoglobin: 15.6 g/dL (ref 13.0–17.0)
MCH: 33.1 pg (ref 26.0–34.0)
MCHC: 36.3 g/dL — ABNORMAL HIGH (ref 30.0–36.0)
MCV: 91.3 fL (ref 80.0–100.0)
Platelets: 295 K/uL (ref 150–400)
RBC: 4.71 MIL/uL (ref 4.22–5.81)
RDW: 11.7 % (ref 11.5–15.5)
WBC: 15.6 K/uL — ABNORMAL HIGH (ref 4.0–10.5)
nRBC: 0 % (ref 0.0–0.2)

## 2024-06-21 LAB — LIPASE, BLOOD: Lipase: 22 U/L (ref 11–51)

## 2024-06-21 MED ORDER — SODIUM CHLORIDE 0.9 % IV BOLUS
1000.0000 mL | Freq: Once | INTRAVENOUS | Status: AC
  Administered 2024-06-21: 1000 mL via INTRAVENOUS

## 2024-06-21 MED ORDER — ACETAMINOPHEN 500 MG PO TABS
1000.0000 mg | ORAL_TABLET | Freq: Four times a day (QID) | ORAL | Status: DC
  Administered 2024-06-22 – 2024-06-23 (×4): 1000 mg via ORAL
  Filled 2024-06-21 (×5): qty 2

## 2024-06-21 MED ORDER — IOHEXOL 300 MG/ML  SOLN
100.0000 mL | Freq: Once | INTRAMUSCULAR | Status: AC | PRN
  Administered 2024-06-21: 100 mL via INTRAVENOUS

## 2024-06-21 MED ORDER — PIPERACILLIN-TAZOBACTAM 3.375 G IVPB
INTRAVENOUS | Status: AC
  Filled 2024-06-21: qty 50

## 2024-06-21 MED ORDER — BUPIVACAINE-EPINEPHRINE (PF) 0.5% -1:200000 IJ SOLN
INTRAMUSCULAR | Status: AC
  Filled 2024-06-21: qty 30

## 2024-06-21 MED ORDER — ONDANSETRON HCL 4 MG/2ML IJ SOLN: 4.0000 mg | Freq: Four times a day (QID) | INTRAMUSCULAR | Status: DC | PRN

## 2024-06-21 MED ORDER — MIDAZOLAM HCL 2 MG/2ML IJ SOLN
INTRAMUSCULAR | Status: AC
  Filled 2024-06-21: qty 2

## 2024-06-21 MED ORDER — LACTATED RINGERS IV SOLN: INTRAVENOUS | Status: DC

## 2024-06-21 MED ORDER — FENTANYL CITRATE (PF) 100 MCG/2ML IJ SOLN
INTRAMUSCULAR | Status: AC
  Filled 2024-06-21: qty 2

## 2024-06-21 MED ORDER — HYDROMORPHONE HCL 1 MG/ML IJ SOLN: 0.5000 mg | INTRAMUSCULAR | Status: DC | PRN

## 2024-06-21 MED ORDER — PANTOPRAZOLE SODIUM 40 MG IV SOLR
40.0000 mg | Freq: Every day | INTRAVENOUS | Status: DC
  Administered 2024-06-21 – 2024-06-22 (×2): 40 mg via INTRAVENOUS
  Filled 2024-06-21 (×2): qty 10

## 2024-06-21 MED ORDER — ROCURONIUM BROMIDE 10 MG/ML (PF) SYRINGE
PREFILLED_SYRINGE | INTRAVENOUS | Status: AC
  Filled 2024-06-21: qty 10

## 2024-06-21 MED ORDER — ONDANSETRON HCL 4 MG/2ML IJ SOLN
INTRAMUSCULAR | Status: AC
  Filled 2024-06-21: qty 2

## 2024-06-21 MED ORDER — LIDOCAINE HCL (PF) 2 % IJ SOLN
INTRAMUSCULAR | Status: AC
Start: 2024-06-21 — End: 2024-06-21
  Filled 2024-06-21: qty 5

## 2024-06-21 MED ORDER — HYDROMORPHONE HCL 1 MG/ML IJ SOLN
1.0000 mg | Freq: Once | INTRAMUSCULAR | Status: AC
  Administered 2024-06-21: 1 mg via INTRAVENOUS
  Filled 2024-06-21: qty 1

## 2024-06-21 MED ORDER — ONDANSETRON HCL 4 MG/2ML IJ SOLN
4.0000 mg | Freq: Once | INTRAMUSCULAR | Status: AC
  Administered 2024-06-21: 4 mg via INTRAVENOUS
  Filled 2024-06-21: qty 2

## 2024-06-21 MED ORDER — PROPOFOL 1000 MG/100ML IV EMUL
INTRAVENOUS | Status: AC
  Filled 2024-06-21: qty 100

## 2024-06-21 MED ORDER — PIPERACILLIN-TAZOBACTAM 3.375 G IVPB
3.3750 g | Freq: Three times a day (TID) | INTRAVENOUS | Status: DC
  Administered 2024-06-21 – 2024-06-23 (×6): 3.375 g via INTRAVENOUS
  Filled 2024-06-21 (×5): qty 50

## 2024-06-21 MED ORDER — ONDANSETRON 4 MG PO TBDP: 4.0000 mg | ORAL_TABLET | Freq: Four times a day (QID) | ORAL | Status: DC | PRN

## 2024-06-21 MED ORDER — POLYETHYLENE GLYCOL 3350 17 G PO PACK: 17.0000 g | PACK | Freq: Every day | ORAL | Status: DC | PRN

## 2024-06-21 MED ORDER — PIPERACILLIN-TAZOBACTAM 3.375 G IVPB
INTRAVENOUS | Status: AC
Start: 2024-06-21 — End: 2024-06-21
  Filled 2024-06-21: qty 50

## 2024-06-21 NOTE — ED Triage Notes (Signed)
 Patient reports abdominal pain and nausea since last night; denies vomiting and diarrhea. States pain has increased since last night.

## 2024-06-21 NOTE — ED Provider Notes (Signed)
 [facility] Provider Note    Event Date/Time   First MD Initiated Contact with Patient 06/21/24 2002     (approximate)   History   Abdominal Pain   HPI  Benjamin Peters is a 38 y.o. male with no significant PMH who presents with lower abdominal pain.  The patient states that last night after eating he started to have more central abdominal pain.  This morning it migrated to his lower abdomen, and is now mainly in his right lower quadrant.  It acutely worsened this evening.  He reports nausea but no vomiting.  He had 1 bowel movement today which was normal.  He denies any urinary symptoms.  I reviewed the past medical records.  The patient's most recent outpatient counter was on 3/24 at [facility] for STD testing.   Physical Exam   Triage Vital Signs: ED Triage Vitals  Encounter Vitals Group     BP 06/21/24 1807 114/71     Girls Systolic BP Percentile --      Girls Diastolic BP Percentile --      Boys Systolic BP Percentile --      Boys Diastolic BP Percentile --      Pulse Rate 06/21/24 1807 93     Resp 06/21/24 1807 18     Temp 06/21/24 1807 97.6 F (36.4 C)     Temp Source 06/21/24 1807 Oral     SpO2 06/21/24 1807 100 %     Weight 06/21/24 1806 200 lb (90.7 kg)     Height 06/21/24 1806 6' 2 (1.88 m)     Head Circumference --      Peak Flow --      Pain Score 06/21/24 1806 8     Pain Loc --      Pain Education --      Exclude from Growth Chart --     Most recent vital signs: Vitals:   06/21/24 2102 06/21/24 2105  BP: (!) 147/80   Pulse: (!) 108 (!) 108  Resp: 16   Temp:    SpO2: 98% 98%     General: Awake, no distress.  CV:  Good peripheral perfusion.  Resp:  Normal effort.  Abd:  Right lower quadrant tenderness.  No distention.  Other:  No jaundice or scleral icterus.   ED Results / Procedures / Treatments   Labs (all labs ordered are listed, but only abnormal results are displayed) Labs Reviewed  COMPREHENSIVE METABOLIC  PANEL WITH GFR - Abnormal; Notable for the following components:      Result Value   Potassium 3.4 (*)    Glucose, Bld 153 (*)    ALT 47 (*)    All other components within normal limits  CBC - Abnormal; Notable for the following components:   WBC 15.6 (*)    MCHC 36.3 (*)    All other components within normal limits  URINALYSIS, ROUTINE W REFLEX MICROSCOPIC - Abnormal; Notable for the following components:   Color, Urine YELLOW (*)    APPearance HAZY (*)    Glucose, UA >=500 (*)    Ketones, ur 20 (*)    All other components within normal limits  LIPASE, BLOOD  HIV ANTIBODY (ROUTINE TESTING W REFLEX)  CBC  BASIC METABOLIC PANEL WITH GFR     EKG     RADIOLOGY  CT abdomen/pelvis: I independently viewed and interpreted the images; there are no dilated bowel loops or any free air or free fluid.  Radiology report indicates acute appendicitis.  IMPRESSION:  Acute appendicitis. No abscess or perforation.    PROCEDURES:  Critical Care performed: No  Procedures   MEDICATIONS ORDERED IN ED: Medications  lactated ringers infusion (has no administration in time range)  acetaminophen (TYLENOL) tablet 1,000 mg (has no administration in time range)  HYDROmorphone (DILAUDID) injection 0.5 mg (has no administration in time range)  polyethylene glycol (MIRALAX / GLYCOLAX) packet 17 g (has no administration in time range)  ondansetron (ZOFRAN-ODT) disintegrating tablet 4 mg (has no administration in time range)    Or  ondansetron (ZOFRAN) injection 4 mg (has no administration in time range)  pantoprazole (PROTONIX) injection 40 mg (has no administration in time range)  piperacillin-tazobactam (ZOSYN) IVPB 3.375 g (has no administration in time range)  sodium chloride  0.9 % bolus 1,000 mL (1,000 mLs Intravenous New Bag/Given 06/21/24 2021)  HYDROmorphone (DILAUDID) injection 1 mg (1 mg Intravenous Given 06/21/24 2022)  ondansetron (ZOFRAN) injection 4 mg (4 mg Intravenous Given  06/21/24 2022)  iohexol  (OMNIPAQUE ) 300 MG/ML solution 100 mL (100 mLs Intravenous Contrast Given 06/21/24 2052)     IMPRESSION / MDM / ASSESSMENT AND PLAN / ED COURSE  I reviewed the triage vital signs and the nursing notes.   Differential diagnosis includes, but is not limited to, appendicitis, colitis, diverticulitis, ureteral stone.  CBC shows leukocytosis.  CMP is unremarkable.  Urinalysis is clear except for glucose.  We will obtain a CT for further evaluation.  Patient's presentation is most consistent with acute presentation with potential threat to life or bodily function.  The patient is on the cardiac monitor to evaluate for evidence of arrhythmia and/or significant heart rate changes.   ----------------------------------------- 9:30 PM on 06/21/2024 -----------------------------------------  CT shows evidence of acute appendicitis.  I consulted and discussed the case with Dr. Desiderio from general surgery who is admitting the patient.  FINAL CLINICAL IMPRESSION(S) / ED DIAGNOSES   Final diagnoses:  Acute appendicitis, unspecified acute appendicitis type     Rx / DC Orders   ED Discharge Orders     None        Note:  This document was prepared using Dragon voice recognition software and may include unintentional dictation errors.    Jacolyn Pae, MD 06/21/24 2131

## 2024-06-21 NOTE — H&P (Signed)
 Date of Admission:  06/21/2024  Reason for Admission:  Acute appendicitis  History of Present Illness: Benjamin Peters is a 38 y.o. male presenting with abdominal pain that started yesterday afternoon.  The patient reports the pain started in a central area after eating.  He thought it was related to the food and the pain was very mild.  He was able to go to sleep but he woke up with discomfort.  Throughout the day, his pain has worsened.  He's had some sweats, nausea, but no emesis.  Also reports feeling more bloated/distended and that's not allowing him to take as deep breaths.  In the ER, the workup showed a high WBC of 15.6.  CT scan showed acute appendicitis with a dilated appendix, without evidence of perforation.  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 Authorizing Provider  ALPRAZolam  (XANAX ) 0.25 MG tablet Take 1-2 tablets (0.25-0.5 mg total) by mouth 3 (three) times daily as needed for anxiety. 01/01/14   Amon Aloysius BRAVO, MD  amoxicillin  (AMOXIL ) 875 MG tablet Take 1 tablet (875 mg total) by mouth 2 (two) times daily. 12/26/13   Gladis Elsie BROCKS, PA-C  doxycycline  (VIBRA -TABS) 100 MG tablet Take 1 tablet (100 mg total) by mouth 2 (two) times daily. 01/08/14   Amon Aloysius BRAVO, MD  DULoxetine  (CYMBALTA ) 60 MG capsule TAKE ONE CAPSULE BY MOUTH ONCE DAILY    Paz, Arbor Cohen E, MD  loratadine (CLARITIN REDITABS) 10 MG dissolvable tablet Take 10 mg by mouth as needed for allergies.    [provider]  zolpidem  (AMBIEN ) 10 MG tablet Take 1 tablet (10 mg total) by mouth at bedtime as  needed. 01/01/14   Amon Aloysius BRAVO, MD    Allergies: No Known Allergies  Social History:  reports that he has never smoked. He has never used smokeless tobacco. He reports current alcohol use. He reports that he does not use drugs.   Family History: Family History  Problem Relation Age of Onset   Obesity Mother    Hypertension Father    Thyroid disease Other        mother side, sisters    Colon cancer Neg Hx    Prostate cancer Neg Hx    Coronary artery disease Other        GM MI, in her 24s     Review of Systems: Review of Systems  Constitutional:  Negative for fever.       Sweats  HENT:  Negative for hearing loss.   Respiratory:  Negative for shortness of breath.   Cardiovascular:  Negative for chest pain.  Gastrointestinal:  Positive for abdominal pain and nausea. Negative for vomiting.  Genitourinary:  Negative for dysuria.  Musculoskeletal:  Negative for myalgias.  Skin:  Negative for rash.  Neurological:  Negative for dizziness.  Psychiatric/Behavioral:  Negative for depression.     Physical Exam BP (!) 147/80   Pulse (!) 108   Temp 97.6 F (36.4 C) (Oral)   Resp 16  Ht 6' 2 (1.88 m)   Wt 90.7 kg   SpO2 98%   BMI 25.68 kg/m  CONSTITUTIONAL: No acute distress, well-nourished HEENT:  Normocephalic, atraumatic, extraocular motion intact. NECK: Trachea is midline, and there is no jugular venous distension.  RESPIRATORY:  Normal respiratory effort without pathologic use of accessory muscles. CARDIOVASCULAR: Regular rhythm and rate. GI: The abdomen is soft, nondistended, with tenderness to palpation in the right lower quadrant.  No diffuse peritonitis.  MUSCULOSKELETAL:  Normal muscle strength and tone in all four extremities.  No peripheral edema or cyanosis. SKIN: Skin turgor is normal. There are no pathologic skin lesions.  NEUROLOGIC:  Motor and sensation is grossly normal.  Cranial nerves are grossly intact. PSYCH:  Alert and oriented to person, place and  time. Affect is normal.  Laboratory Analysis: Results for orders placed or performed during the hospital encounter of 06/21/24 (from the past 24 hours)  Lipase, blood     Status: None   Collection Time: 06/21/24  6:08 PM  Result Value Ref Range   Lipase 22 11 - 51 U/L  Comprehensive metabolic panel     Status: Abnormal   Collection Time: 06/21/24  6:08 PM  Result Value Ref Range   Sodium 137 135 - 145 mmol/L   Potassium 3.4 (L) 3.5 - 5.1 mmol/L   Chloride 100 98 - 111 mmol/L   CO2 23 22 - 32 mmol/L   Glucose, Bld 153 (H) 70 - 99 mg/dL   BUN 13 6 - 20 mg/dL   Creatinine, Ser 9.15 0.61 - 1.24 mg/dL   Calcium 9.0 8.9 - 89.6 mg/dL   Total Protein 7.7 6.5 - 8.1 g/dL   Albumin 4.4 3.5 - 5.0 g/dL   AST 29 15 - 41 U/L   ALT 47 (H) 0 - 44 U/L   Alkaline Phosphatase 65 38 - 126 U/L   Total Bilirubin 1.1 0.0 - 1.2 mg/dL   GFR, Estimated >39 >39 mL/min   Anion gap 14 5 - 15  CBC     Status: Abnormal   Collection Time: 06/21/24  6:08 PM  Result Value Ref Range   WBC 15.6 (H) 4.0 - 10.5 K/uL   RBC 4.71 4.22 - 5.81 MIL/uL   Hemoglobin 15.6 13.0 - 17.0 g/dL   HCT 56.9 60.9 - 47.9 %   MCV 91.3 80.0 - 100.0 fL   MCH 33.1 26.0 - 34.0 pg   MCHC 36.3 (H) 30.0 - 36.0 g/dL   RDW 88.2 88.4 - 84.4 %   Platelets 295 150 - 400 K/uL   nRBC 0.0 0.0 - 0.2 %  Urinalysis, Routine w reflex microscopic -Urine, Clean Catch     Status: Abnormal   Collection Time: 06/21/24  8:10 PM  Result Value Ref Range   Color, Urine YELLOW (A) YELLOW   APPearance HAZY (A) CLEAR   Specific Gravity, Urine 1.018 1.005 - 1.030   pH 5.0 5.0 - 8.0   Glucose, UA >=500 (A) NEGATIVE mg/dL   Hgb urine dipstick NEGATIVE NEGATIVE   Bilirubin Urine NEGATIVE NEGATIVE   Ketones, ur 20 (A) NEGATIVE mg/dL   Protein, ur NEGATIVE NEGATIVE mg/dL   Nitrite NEGATIVE NEGATIVE   Leukocytes,Ua NEGATIVE NEGATIVE   RBC / HPF 0 0 - 5 RBC/hpf   WBC, UA 0-5 0 - 5 WBC/hpf   Bacteria, UA NONE SEEN NONE SEEN   Squamous Epithelial / HPF 0-5 0  - 5 /HPF   Mucus PRESENT     Imaging: CT  ABDOMEN PELVIS W CONTRAST Result Date: 06/21/2024 CLINICAL DATA:  Right lower quadrant abdominal pain. EXAM: CT ABDOMEN AND PELVIS WITH CONTRAST TECHNIQUE: Multidetector CT imaging of the abdomen and pelvis was performed using the standard protocol following bolus administration of intravenous contrast. RADIATION DOSE REDUCTION: This exam was performed according to the departmental dose-optimization program which includes automated exposure control, adjustment of the mA and/or kV according to patient size and/or use of iterative reconstruction technique. CONTRAST:  OMNIPAQUE  IOHEXOL  300 MG/ML  SOLN COMPARISON:  CT abdomen pelvis dated 05/28/2013. FINDINGS: Lower chest: The visualized lung bases are clear. No intra-abdominal free air.  Small free fluid in the pelvis. Hepatobiliary: Probable fatty liver. No biliary dilatation. The gallbladder is unremarkable. Pancreas: Unremarkable. No pancreatic ductal dilatation or surrounding inflammatory changes. Spleen: Normal in size without focal abnormality. Adrenals/Urinary Tract: The adrenal glands are unremarkable. The kidneys, visualized ureters, and urinary bladder appear unremarkable. Stomach/Bowel: There is no bowel obstruction. The appendix is enlarged and inflamed measuring approximately 17 mm in diameter. The appendix is located in the right lower quadrant medial to the cecum extending into the right hemipelvis. No abscess or perforation. Vascular/Lymphatic: The abdominal aorta and IVC are unremarkable. No portal venous gas. There is no adenopathy. Reproductive: The prostate and seminal vesicles are grossly remarkable. Other: Small fat containing bilateral inguinal hernias. Musculoskeletal: No acute or significant osseous findings. IMPRESSION: Acute appendicitis. No abscess or perforation. Electronically Signed   By: Vanetta Chou M.D.   On: 06/21/2024 21:08    Assessment and Plan: This is a 38 y.o. male  with acute appendicitis.  - Discussed with the patient the findings on his imaging study and laboratory workup.  Overall he does have acute appendicitis but without any evidence of perforation.  His appendix is dilated with inflammatory changes.  Discussed with patient the recommendation for surgical management in the form of appendectomy.  He is in agreement. - Discussed with him then the plan for robotic assisted appendectomy and reviewed the surgery at length with him including the planned incisions, risks of bleeding, infection, injury to surrounding structures, hospital stay, pain control, possibility of drain, and he is willing to proceed. - Patient will be admitted to the surgical team and will be taken to the operating room tonight for appendectomy.  He will remain n.p.o. for the time being, with IV fluid hydration.  He will be started on IV Zosyn. - All of his questions have been answered.  I spent 75 minutes dedicated to the care of this patient on the date of this encounter to include pre-visit review of records, face-to-face time with the patient discussing diagnosis and management, and any post-visit coordination of care.   Aloysius Sheree Plant, MD [facility] Pg:  [phone]

## 2024-06-21 NOTE — Anesthesia Preprocedure Evaluation (Addendum)
 Anesthesia Evaluation  Patient identified by MRN, date of birth, ID band Patient awake    Reviewed: Allergy & Precautions, H&P , NPO status , Patient's Chart, lab work & pertinent test results  Airway Mallampati: II  TM Distance: >3 FB Neck ROM: full    Dental no notable dental hx.    Pulmonary neg pulmonary ROS   Pulmonary exam normal        Cardiovascular negative cardio ROS Normal cardiovascular exam     Neuro/Psych  PSYCHIATRIC DISORDERS Anxiety     negative neurological ROS     GI/Hepatic Neg liver ROS,,,  Endo/Other  negative endocrine ROS    Renal/GU      Musculoskeletal   Abdominal  (+)  Abdomen: soft.   Peds  Hematology negative hematology ROS (+)   Anesthesia Other Findings acute appendicitis  Past Medical History: 11/27/2012: Anxiety state, unspecified No date: Diaphoresis     Comment:  excessive No date: Hematuria     Comment:  hx - post strep (as a teenager) No date: Tachycardia No date: Vertigo     Comment:  since 2005, after a viral illnes , has sx on-off  Past Surgical History: No date: WISDOM TOOTH EXTRACTION  BMI    Body Mass Index: 25.68 kg/m      Reproductive/Obstetrics negative OB ROS                              Anesthesia Physical Anesthesia Plan  ASA: 2  Anesthesia Plan: General ETT   Post-op Pain Management: Ofirmev IV (intra-op)* and Toradol IV (intra-op)*   Induction: Intravenous  PONV Risk Score and Plan: 3 and Ondansetron, Dexamethasone, Midazolam, Propofol infusion and TIVA  Airway Management Planned: Oral ETT  Additional Equipment:   Intra-op Plan:   Post-operative Plan: Extubation in OR  Informed Consent: I have reviewed the patients History and Physical, chart, labs and discussed the procedure including the risks, benefits and alternatives for the proposed anesthesia with the patient or authorized representative who has  indicated his/her understanding and acceptance.     Dental Advisory Given  Plan Discussed with: CRNA and Surgeon  Anesthesia Plan Comments:          Anesthesia Quick Evaluation

## 2024-06-21 NOTE — Progress Notes (Signed)
 Patient in bed, texting on phone. Dr. Desiderio at bedside. Patient made aware emergency surgery needing to be scheduled before his time. Patient verbalizes understanding. Remains NPO. Per Dr. Desiderio please send  patient to his assigned room and will call for patient when emergency surgery complete. Patient verbalizes understanding.

## 2024-06-22 ENCOUNTER — Observation Stay: Admitting: Anesthesiology

## 2024-06-22 ENCOUNTER — Other Ambulatory Visit: Payer: Self-pay

## 2024-06-22 ENCOUNTER — Encounter
Admission: EM | Disposition: A | Discharge status: Home / Self Care | Payer: Self-pay | Source: Home / Self Care | Attending: Emergency Medicine

## 2024-06-22 DIAGNOSIS — K3532 Acute appendicitis with perforation and localized peritonitis, without abscess: Secondary | ICD-10-CM | POA: Diagnosis not present

## 2024-06-22 HISTORY — PX: XI ROBOTIC LAPAROSCOPIC ASSISTED APPENDECTOMY: SHX6877

## 2024-06-22 LAB — BASIC METABOLIC PANEL WITH GFR
Anion gap: 11 (ref 5–15)
BUN: 12 mg/dL (ref 6–20)
CO2: 24 mmol/L (ref 22–32)
Calcium: 8.1 mg/dL — ABNORMAL LOW (ref 8.9–10.3)
Chloride: 102 mmol/L (ref 98–111)
Creatinine, Ser: 1.03 mg/dL (ref 0.61–1.24)
GFR, Estimated: >60 mL/min (ref >60)
Glucose, Bld: 200 mg/dL — ABNORMAL HIGH (ref 70–99)
Potassium: 3.8 mmol/L (ref 3.5–5.1)
Sodium: 137 mmol/L (ref 135–145)

## 2024-06-22 LAB — CBC
HCT: 36.9 % — ABNORMAL LOW (ref 39.0–52.0)
Hemoglobin: 13.3 g/dL (ref 13.0–17.0)
MCH: 33.4 pg (ref 26.0–34.0)
MCHC: 36 g/dL (ref 30.0–36.0)
MCV: 92.7 fL (ref 80.0–100.0)
Platelets: 207 K/uL (ref 150–400)
RBC: 3.98 MIL/uL — ABNORMAL LOW (ref 4.22–5.81)
RDW: 11.9 % (ref 11.5–15.5)
WBC: 12.7 K/uL — ABNORMAL HIGH (ref 4.0–10.5)
nRBC: 0 % (ref 0.0–0.2)

## 2024-06-22 LAB — HIV ANTIBODY (ROUTINE TESTING W REFLEX): HIV Screen 4th Generation wRfx: NONREACTIVE

## 2024-06-22 SURGERY — APPENDECTOMY, ROBOT-ASSISTED, LAPAROSCOPIC
Anesthesia: General | Site: Abdomen

## 2024-06-22 MED ORDER — AMOXICILLIN-POT CLAVULANATE 875-125 MG PO TABS
1.0000 | ORAL_TABLET | Freq: Two times a day (BID) | ORAL | 0 refills | Status: AC
  Filled 2024-06-22: qty 14, 7d supply, fill #0

## 2024-06-22 MED ORDER — IBUPROFEN 600 MG PO TABS
600.0000 mg | ORAL_TABLET | Freq: Three times a day (TID) | ORAL | 1 refills | Status: AC | PRN
  Filled 2024-06-22: qty 60, 20d supply, fill #0

## 2024-06-22 MED ORDER — MIDAZOLAM HCL (PF) 2 MG/2ML IJ SOLN
INTRAMUSCULAR | Status: DC | PRN
  Administered 2024-06-22: 2 mg via INTRAVENOUS

## 2024-06-22 MED ORDER — LIDOCAINE HCL (PF) 2 % IJ SOLN
INTRAMUSCULAR | Status: DC | PRN
  Administered 2024-06-22: 100 mg

## 2024-06-22 MED ORDER — KETOROLAC TROMETHAMINE 30 MG/ML IJ SOLN
INTRAMUSCULAR | Status: DC | PRN
  Administered 2024-06-22: 30 mg via INTRAVENOUS

## 2024-06-22 MED ORDER — DROPERIDOL 2.5 MG/ML IJ SOLN
0.6250 mg | Freq: Once | INTRAMUSCULAR | Status: AC | PRN
  Administered 2024-06-22: 0.625 mg via INTRAVENOUS

## 2024-06-22 MED ORDER — 0.9 % SODIUM CHLORIDE (POUR BTL) OPTIME
TOPICAL | Status: DC | PRN
  Administered 2024-06-22: 15 mL

## 2024-06-22 MED ORDER — OXYCODONE HCL 5 MG PO TABS
5.0000 mg | ORAL_TABLET | ORAL | Status: DC | PRN
  Administered 2024-06-22 (×2): 5 mg via ORAL
  Filled 2024-06-22 (×2): qty 1

## 2024-06-22 MED ORDER — BUPIVACAINE-EPINEPHRINE (PF) 0.5% -1:200000 IJ SOLN
INTRAMUSCULAR | Status: DC | PRN
  Administered 2024-06-22: 30 mL

## 2024-06-22 MED ORDER — DROPERIDOL 2.5 MG/ML IJ SOLN
INTRAMUSCULAR | Status: AC
  Filled 2024-06-22: qty 2

## 2024-06-22 MED ORDER — PROPOFOL 500 MG/50ML IV EMUL
INTRAVENOUS | Status: DC | PRN
  Administered 2024-06-22: 60 ug/kg/min via INTRAVENOUS
  Administered 2024-06-22: 200 ug via INTRAVENOUS

## 2024-06-22 MED ORDER — OXYCODONE HCL 5 MG PO TABS: 5.0000 mg | ORAL_TABLET | Freq: Once | ORAL | Status: DC | PRN

## 2024-06-22 MED ORDER — ACETAMINOPHEN 500 MG PO TABS: 1000.0000 mg | ORAL_TABLET | Freq: Four times a day (QID) | ORAL | Status: AC | PRN

## 2024-06-22 MED ORDER — KETOROLAC TROMETHAMINE 30 MG/ML IJ SOLN
30.0000 mg | Freq: Four times a day (QID) | INTRAMUSCULAR | Status: DC
Start: 2024-06-22 — End: 2024-06-27
  Administered 2024-06-22 – 2024-06-23 (×3): 30 mg via INTRAVENOUS
  Filled 2024-06-22 (×4): qty 1

## 2024-06-22 MED ORDER — KETOROLAC TROMETHAMINE 30 MG/ML IJ SOLN
INTRAMUSCULAR | Status: AC
  Filled 2024-06-22: qty 1

## 2024-06-22 MED ORDER — ONDANSETRON HCL 4 MG/2ML IJ SOLN
INTRAMUSCULAR | Status: DC | PRN
  Administered 2024-06-22: 4 mg via INTRAVENOUS

## 2024-06-22 MED ORDER — SUGAMMADEX SODIUM 200 MG/2ML IV SOLN
INTRAVENOUS | Status: DC | PRN
  Administered 2024-06-22: 200 mg via INTRAVENOUS

## 2024-06-22 MED ORDER — ACETAMINOPHEN 10 MG/ML IV SOLN: 1000.0000 mg | Freq: Once | INTRAVENOUS | Status: DC | PRN

## 2024-06-22 MED ORDER — FENTANYL CITRATE (PF) 100 MCG/2ML IJ SOLN: 25.0000 ug | INTRAMUSCULAR | Status: DC | PRN

## 2024-06-22 MED ORDER — ROCURONIUM BROMIDE 10 MG/ML (PF) SYRINGE
PREFILLED_SYRINGE | INTRAVENOUS | Status: DC | PRN
  Administered 2024-06-22: 90 mg via INTRAVENOUS

## 2024-06-22 MED ORDER — FENTANYL CITRATE (PF) 100 MCG/2ML IJ SOLN
INTRAMUSCULAR | Status: DC | PRN
  Administered 2024-06-22 (×2): 50 ug via INTRAVENOUS

## 2024-06-22 MED ORDER — DEXAMETHASONE SOD PHOSPHATE PF 10 MG/ML IJ SOLN
INTRAMUSCULAR | Status: DC | PRN
  Administered 2024-06-22: 10 mg via INTRAVENOUS

## 2024-06-22 MED ORDER — ACETAMINOPHEN 10 MG/ML IV SOLN
INTRAVENOUS | Status: DC | PRN
Start: 2024-06-22 — End: 2024-06-22
  Administered 2024-06-22: 1000 mg via INTRAVENOUS

## 2024-06-22 MED ORDER — OXYCODONE HCL 5 MG PO TABS
5.0000 mg | ORAL_TABLET | ORAL | 0 refills | Status: DC | PRN
  Filled 2024-06-22: qty 30, 5d supply, fill #0

## 2024-06-22 MED ORDER — OXYCODONE HCL 5 MG/5ML PO SOLN: 5.0000 mg | Freq: Once | ORAL | Status: DC | PRN

## 2024-06-22 MED ORDER — SODIUM CHLORIDE 0.9 % IR SOLN
Status: DC | PRN
  Administered 2024-06-22: 500 mL

## 2024-06-22 MED ORDER — ZOLPIDEM TARTRATE 5 MG PO TABS
5.0000 mg | ORAL_TABLET | Freq: Every evening | ORAL | Status: DC | PRN
  Administered 2024-06-22: 5 mg via ORAL
  Filled 2024-06-22: qty 1

## 2024-06-22 MED ORDER — ACETAMINOPHEN 10 MG/ML IV SOLN
INTRAVENOUS | Status: AC
  Filled 2024-06-22: qty 100

## 2024-06-22 SURGICAL SUPPLY — 39 items
BAG PRESSURE INF REUSE 1000 (BAG) IMPLANT
COVER WAND RF STERILE (DRAPES) ×1 IMPLANT
DEFOGGER SCOPE WARM SEASHARP (MISCELLANEOUS) ×1 IMPLANT
DERMABOND ADVANCED .7 DNX12 (GAUZE/BANDAGES/DRESSINGS) ×1 IMPLANT
DRAPE ARM DVNC X/XI (DISPOSABLE) ×3 IMPLANT
DRAPE COLUMN DVNC XI (DISPOSABLE) ×1 IMPLANT
DRSG TEGADERM 4X4.75 (GAUZE/BANDAGES/DRESSINGS) IMPLANT
ELECTRODE REM PT RTRN 9FT ADLT (ELECTROSURGICAL) ×1 IMPLANT
FORCEPS BPLR R/ABLATION 8 DVNC (INSTRUMENTS) ×1 IMPLANT
GAUZE SPONGE 4X4 12PLY STRL (GAUZE/BANDAGES/DRESSINGS) IMPLANT
GLOVE SURG SYN 7.0 PF PI (GLOVE) ×2 IMPLANT
GLOVE SURG SYN 7.5 PF PI (GLOVE) ×2 IMPLANT
GOWN STRL REUS W/ TWL LRG LVL3 (GOWN DISPOSABLE) ×3 IMPLANT
GRASPER SUT TROCAR 14GX15 (MISCELLANEOUS) IMPLANT
IRRIGATOR SUCT 8 DISP DVNC XI (IRRIGATION / IRRIGATOR) IMPLANT
IV 0.9% NACL 1000 ML (IV SOLUTION) IMPLANT
KIT PINK PAD W/HEAD ARM REST (MISCELLANEOUS) ×1 IMPLANT
LABEL OR SOLS (LABEL) IMPLANT
MANIFOLD NEPTUNE II (INSTRUMENTS) ×1 IMPLANT
NDL HYPO 22X1.5 SAFETY MO (MISCELLANEOUS) ×1 IMPLANT
NDL INSUFFLATION 14GA 120MM (NEEDLE) ×1 IMPLANT
NEEDLE HYPO 22X1.5 SAFETY MO (MISCELLANEOUS) ×1 IMPLANT
NEEDLE INSUFFLATION 14GA 120MM (NEEDLE) ×1 IMPLANT
OBTURATOR OPTICALSTD 8 DVNC (TROCAR) ×1 IMPLANT
PACK LAP CHOLECYSTECTOMY (MISCELLANEOUS) ×1 IMPLANT
RELOAD STAPLE 45 3.5 BLU DVNC (STAPLE) IMPLANT
SEAL UNIV 5-12 XI (MISCELLANEOUS) ×3 IMPLANT
SEALER VESSEL EXT DVNC XI (MISCELLANEOUS) ×1 IMPLANT
SET TUBE FILTERED XL AIRSEAL (SET/KITS/TRAYS/PACK) IMPLANT
SET TUBE SMOKE EVAC HIGH FLOW (TUBING) ×1 IMPLANT
SOLUTION ELECTROSURG ANTI STCK (MISCELLANEOUS) ×1 IMPLANT
STAPLER 45 SUREFORM DVNC (STAPLE) IMPLANT
SUT ETHILON 2 0 FS 18 (SUTURE) IMPLANT
SUT MNCRL AB 4-0 PS2 18 (SUTURE) ×1 IMPLANT
SUT VIC AB 3-0 SH 27X BRD (SUTURE) ×1 IMPLANT
SUT VICRYL 0 UR6 27IN ABS (SUTURE) ×2 IMPLANT
SYSTEM BAG RETRIEVAL 10MM (BASKET) ×1 IMPLANT
TRAY FOLEY MTR SLVR 16FR STAT (SET/KITS/TRAYS/PACK) ×1 IMPLANT
WATER STERILE IRR 500ML POUR (IV SOLUTION) ×1 IMPLANT

## 2024-06-22 NOTE — Anesthesia Procedure Notes (Signed)
 Procedure Name: Intubation Date/Time: 06/22/2024 3:53 AM  Performed by: Vicci Camellia Glatter, MDPre-anesthesia Checklist: Patient identified, Emergency Drugs available, Suction available and Patient being monitored Patient Re-evaluated:Patient Re-evaluated prior to induction Oxygen Delivery Method: Circle system utilized Preoxygenation: Pre-oxygenation with 100% oxygen Induction Type: IV induction Ventilation: Mask ventilation without difficulty Laryngoscope Size: McGrath Grade View: Grade I Tube type: Oral Tube size: 7.0 mm Number of attempts: 1 Airway Equipment and Method: Stylet and Oral airway Placement Confirmation: ETT inserted through vocal cords under direct vision, positive ETCO2 and breath sounds checked- equal and bilateral Secured at: 22 cm Tube secured with: Tape Dental Injury: Teeth and Oropharynx as per pre-operative assessment

## 2024-06-22 NOTE — Transfer of Care (Signed)
 Immediate Anesthesia Transfer of Care Note  Patient: Benjamin Peters  Procedure(s) Performed: APPENDECTOMY, ROBOT-ASSISTED, LAPAROSCOPIC (Abdomen)  Patient Location: PACU  Anesthesia Type:General  Level of Consciousness: awake and alert   Airway & Oxygen Therapy: Patient Spontanous Breathing  Post-op Assessment: Report given to RN, Post -op Vital signs reviewed and stable, and Patient moving all extremities  Post vital signs: Reviewed and stable  Last Vitals:  Vitals Value Taken Time  BP 112/62 06/22/24 05:04  Temp    Pulse 95 06/22/24 05:05  Resp 14 06/22/24 05:05  SpO2 97 % 06/22/24 05:05  Vitals shown include unfiled device data.  Last Pain:  Vitals:   06/21/24 2253  TempSrc: Oral  PainSc: 0-No pain         Complications: No notable events documented.

## 2024-06-22 NOTE — Op Note (Addendum)
 Procedure Date:  06/22/2024  Pre-operative Diagnosis:  Acute appendicitis  Post-operative Diagnosis:  Acute perforated appendicitis  Procedure:  Robotic assisted Appendectomy  Surgeon:  Aloysius Sheree Plant, MD  Anesthesia:  General endotracheal  Estimated Blood Loss:  10 ml  Specimens:  Appendix  Complications:  None  Indications for Procedure:  This is a 38 y.o. male who presents with diagnosis of acute appendicitis.  The options of surgery versus medical management were reviewed with the patient and/or family. The risks of bleeding, abscess or infection, recurrence of symptoms, potential for an open procedure, injury to surrounding structures, and chronic pain were all discussed with the patient and he was willing to proceed.  Description of Procedure: The patient was correctly identified in the preoperative area and brought into the operating room.  The patient was placed supine with VTE prophylaxis in place.  Appropriate time-outs were performed.  Anesthesia was induced and the patient was intubated.  Appropriate antibiotics were infused.  Foley catheter was inserted.  The abdomen was prepped and draped in a sterile fashion.  A Veress needle was introduced in the left upper quadrant and pneumoperitoneum was obtained with appropriate pressures.  Using Optiview technique, an 8 mm port was introduced in the left lateral abdominal wall without complications.  Then, a 12 mm port was introduced in the left upper quadrant and an 8 mm port in the left lower quadrant under direct visualization.  The DaVinci platform was docked, camera targeted, and instruments placed under direct visualization.  The right lower quadrant was inspected and the appendix was identified and found to be acutely inflamed with a point of perforation proximally, resulting in purulent peritonitis.  The appendix was carefully dissected.  The mesoappendix was divided using the Vessel Sealer.  The base of the appendix was  dissected out and divided with a 45 mm blue load stapler.  The appendix was placed in an Endocatch bag.  The right lower quadrant was then inspected again revealing an intact staple line, no bleeding, and no bowel injury.  The area was thoroughly irrigated.  A 19 Fr. Blake drain was placed through the LLQ port going to the pelvis and RLQ.  The DaVinci platform was then undocked and instruments removed.    The 12 mm port was removed and the Endocatch Bag retrieved.  30 ml of 0.5% bupivacaine with epi was infiltrated around the port sites.  The fascia was closed under direct visualization utilizing an Endo Close technique with 0 Vicryl suture.  The 8 mm ports were removed. The 12 mm incision was closed using 3-0 Vicryl and 4-0 Monocryl, and the other port incisions were closed with 4-0 Monocryl.  The drain was secured using 2-0 Nylon.  The wounds were cleaned and sealed with DermaBond.  The drain was dressed with 4x4 gauze and TegaDerm.  Foley catheter was removed and the patient was emerged from anesthesia and extubated and brought to the recovery room for further management.    The patient tolerated the procedure well and all counts were correct at the end of the case.   Aloysius Sheree Plant, MD

## 2024-06-22 NOTE — Discharge Instructions (Signed)
 In addition to included general post-operative instructions ,  Diet: Resume home diet.   Activity: No heavy lifting >20 pounds (children, pets, laundry, garbage) or strenuous activity for 4 weeks, but light activity and walking are encouraged. Do not drive or drink alcohol if taking narcotic pain medications or having pain that might distract from driving.  Drain: Monitor and record drain output daily; hand out given. Please bring this with you to follow up.    Wound care: You may shower/get incision wet with soapy water and pat dry (do not rub incisions), but no baths or submerging incision underwater until follow-up.   Medications: Resume all home medications. For mild to moderate pain: acetaminophen (Tylenol) or ibuprofen/naproxen (if no kidney disease). Combining Tylenol with alcohol can substantially increase your risk of causing liver disease. Narcotic pain medications, if prescribed, can be used for severe pain, though may cause nausea, constipation, and drowsiness. Do not combine Tylenol and Percocet (or similar) within a 6 hour period as Percocet (and similar) contain(s) Tylenol. If you do not need the narcotic pain medication, you do not need to fill the prescription.  Call office [phone] / [phone]) at any time if any questions, worsening pain, fevers/chills, bleeding, drainage from incision site, or other concerns.

## 2024-06-22 NOTE — Plan of Care (Signed)

## 2024-06-22 NOTE — Progress Notes (Signed)
 [facility] SURGICAL PROGRESS NOTE  Hospital Day(s): 0.   Post op day(s): * Day of Surgery *.   Interval History:  Patient seen and examined No acute events or new complaints overnight.  Patient reports he is doing well Abdomen is sore; tired No fever, chills, nausea, emesis Leukocytosis is improving; WBC 12.7K Hgb to 13.3 Renal function normal; sCr - 1.03; UO - 625 ccs + unmeasured Drain output not recorded; serous CLD; No issues Zosyn   Vital signs in last 24 hours: [min-max] current  Temp:  [97.6 F (36.4 C)-99.8 F (37.7 C)] 98 F (36.7 C) (10/31 0710) Pulse Rate:  [73-113] 81 (10/31 0710) Resp:  [13-19] 16 (10/31 0710) BP: (108-147)/(62-97) 114/73 (10/31 0710) SpO2:  [95 %-100 %] 95 % (10/31 0710) Weight:  [90.7 kg] 90.7 kg (10/30 1806)     Height: 6' 2 (188 cm) Weight: 90.7 kg BMI (Calculated): 25.67   Intake/Output last 2 shifts:  10/30 0701 - 10/31 0700 In: 2396.9 [I.V.:1300.3; IV Piggyback:1096.5] Out: 635 [Urine:625; Blood:10]   Physical Exam:  Constitutional: alert, cooperative and no distress  Respiratory: breathing non-labored at rest  Cardiovascular: regular rate and sinus rhythm  Gastrointestinal: soft, incisional soreness, and non-distended, surgical drain in left abdomen; output serous Integumentary: Laparoscopic incisions are CDI with dermabond, no erythema or drainage   Labs:     Latest Ref Rng & Units 06/22/2024    6:03 AM 06/21/2024    6:08 PM 06/22/2013    3:38 PM  CBC  WBC 4.0 - 10.5 K/uL 12.7  15.6  5.9   Hemoglobin 13.0 - 17.0 g/dL 86.6  84.3  86.6   Hematocrit 39.0 - 52.0 % 36.9  43.0  38.0   Platelets 150 - 400 K/uL 207  295  214       Latest Ref Rng & Units 06/22/2024    6:03 AM 06/21/2024    6:08 PM 06/22/2013    3:38 PM  CMP  Glucose 70 - 99 mg/dL 799  846    BUN 6 - 20 mg/dL 12  13    Creatinine 9.38 - 1.24 mg/dL 8.96  9.15    Sodium 864 - 145 mmol/L 137  137    Potassium 3.5 - 5.1 mmol/L 3.8  3.4     Chloride 98 - 111 mmol/L 102  100    CO2 22 - 32 mmol/L 24  23    Calcium 8.9 - 10.3 mg/dL 8.1  9.0    Total Protein 6.5 - 8.1 g/dL  7.7    Total Bilirubin 0.0 - 1.2 mg/dL  1.1    Alkaline Phos 38 - 126 U/L  65    AST 15 - 41 U/L  29  27   ALT 0 - 44 U/L  47  42      Imaging studies: No new pertinent imaging studies   Assessment/Plan:  38 y.o. male seen same day of robotic assisted laparoscopic appendectomy for perforated appendicitis    - Okay to advance diet as tolerated today - Continue IV Abx (Zosyn); Will need PO Augmentin  for home - Continue surgical drain; monitor and record output daily - He will go home with this - Monitor abdominal examination   - Pain control prn; antiemetics prn   - Mobilize as tolerated    - Discharge Planning: Will stay another 24 hours for IV Abx. He should be okay for discharge tomorrow (11/01) with analgesics, antibiotics, and drain. Will updated follow up and instructions  All of the above findings and recommendations were discussed with the patient, and the medical team, and all of patient's questions were answered to his expressed satisfaction.  -- Arthea Platt, PA-C [facility] 06/22/2024, 8:54 AM M-F: 7am - 4pm

## 2024-06-23 ENCOUNTER — Encounter: Payer: Self-pay | Admitting: General Surgery

## 2024-06-23 ENCOUNTER — Other Ambulatory Visit: Payer: Self-pay

## 2024-06-23 MED ORDER — PANTOPRAZOLE SODIUM 40 MG PO TBEC: 40.0000 mg | DELAYED_RELEASE_TABLET | Freq: Every day | ORAL | Status: DC

## 2024-06-23 NOTE — Plan of Care (Signed)

## 2024-06-23 NOTE — Discharge Summary (Signed)
 Patient ID: Benjamin Peters MRN: [id number] DOB/AGE: 01/14/1986 38 y.o.  Admit date: 06/21/2024 Discharge date: 06/23/2024   Discharge Diagnoses:  Principal Problem:   Acute appendicitis   Procedures:robotic assisted appendectomy  Hospital Course:   admitted with findings consistent with acute acute appendicitis and  was taken promptly to the operating room for an uneventful laparoscopic robotic appendectomy.  Patient was kept overnight.  The time of discharge the patient was ambulating,  pain was controlled.  Her vital signs were stable and she was afebrile.   physical exam at discharge showed a pt  in no acute distress.  Awake and alert.  Abdomen: Soft incisions healing well without infection or peritonitis.  Drain with serous output. Extremities well-perfused and no edema.  Condition of the patient the time of discharge was stable   Consults: None  Disposition: Discharge disposition: 01-Home or Self Care       Discharge Instructions     Call MD for:  difficulty breathing, headache or visual disturbances   Complete by: As directed    Call MD for:  extreme fatigue   Complete by: As directed    Call MD for:  hives   Complete by: As directed    Call MD for:  persistant dizziness or light-headedness   Complete by: As directed    Call MD for:  persistant nausea and vomiting   Complete by: As directed    Call MD for:  redness, tenderness, or signs of infection (pain, swelling, redness, odor or green/yellow discharge around incision site)   Complete by: As directed    Call MD for:  severe uncontrolled pain   Complete by: As directed    Call MD for:  temperature >100.4   Complete by: As directed    Diet - low sodium heart healthy   Complete by: As directed    Increase activity slowly   Complete by: As directed       Allergies as of 06/23/2024   No Known Allergies      Medication List     TAKE these medications    acetaminophen 500 MG tablet Commonly known as:  TYLENOL Take 2 tablets (1,000 mg total) by mouth every 6 (six) hours as needed for mild pain (pain score 1-3).   amoxicillin -clavulanate 875-125 MG tablet Commonly known as: AUGMENTIN  Take 1 tablet by mouth 2 (two) times daily for 7 days.   ibuprofen 600 MG tablet Commonly known as: ADVIL Take 1 tablet (600 mg total) by mouth every 8 (eight) hours as needed for moderate pain (pain score 4-6).   oxyCODONE 5 MG immediate release tablet Commonly known as: Oxy IR/ROXICODONE Take 1 tablet (5 mg total) by mouth every 4 (four) hours as needed for severe pain (pain score 7-10).   sildenafil 20 MG tablet Commonly known as: REVATIO Take 5 tablets by mouth as directed. As needed   valACYclovir 1000 MG tablet Commonly known as: VALTREX Take 500 mg by mouth daily. as directed for 90 days.   zolpidem  10 MG tablet Commonly known as: AMBIEN  Take 1 tablet (10 mg total) by mouth at bedtime as needed.        Follow-up Information     Schulz, Zachary R, PA-C. Go on 06/27/2024.   Specialty: Physician Assistant Why: Go to appointment on 11/05 at 1030 AM Contact information: [address] [place] [address] [phone]                  Jayson Endow, M.D.

## 2024-06-25 ENCOUNTER — Encounter: Payer: Self-pay | Admitting: Surgery

## 2024-06-25 LAB — SURGICAL PATHOLOGY

## 2024-06-27 ENCOUNTER — Encounter: Payer: Self-pay | Admitting: Physician Assistant

## 2024-06-27 ENCOUNTER — Ambulatory Visit (INDEPENDENT_AMBULATORY_CARE_PROVIDER_SITE_OTHER): Payer: Self-pay | Admitting: Physician Assistant

## 2024-06-27 VITALS — BP 148/92 | HR 85 | Temp 98.1°F | Ht 74.0 in | Wt 201.4 lb

## 2024-06-27 DIAGNOSIS — Z09 Encounter for follow-up examination after completed treatment for conditions other than malignant neoplasm: Secondary | ICD-10-CM

## 2024-06-27 DIAGNOSIS — K3532 Acute appendicitis with perforation and localized peritonitis, without abscess: Secondary | ICD-10-CM

## 2024-06-27 NOTE — Patient Instructions (Signed)

## 2024-06-27 NOTE — Progress Notes (Signed)
 [facility] POST-OP OFFICE VISIT  06/27/2024  HPI: Benjamin Peters is a 38 y.o. male 5 days s/p robotic assisted laparoscopic appendectomy for perforated appendicitis with Dr Desiderio   He is doing well Only complaint is drain itself Otherwise no abdominal pain No fever, chills, nausea, emesis Drain serous; <20 ccs daily Incisions well healed No other complaints   Vital signs: BP (!) 148/92   Pulse 85   Temp 98.1 F (36.7 C) (Oral)   Ht 6' 2 (1.88 m)   Wt 201 lb 6.4 oz (91.4 kg)   SpO2 100%   BMI 25.86 kg/m    Physical Exam: Constitutional: Well appearing male, NAD Abdomen: Soft, non-tender, non-distended, no rebound/guarding, surgical drain in LLQ; output serous (removed) Skin: Laparoscopic incisions are healing well, no erythema or drainage   Assessment/Plan: This is a 38 y.o. male 5 days s/p robotic assisted laparoscopic appendectomy for perforated appendicitis with Dr Desiderio    - Drain removed; dressing placed  - Pain control prn; OTC medications should be sufficient  - Reviewed wound care recommendation  - Reviewed lifting restrictions; 4 weeks total - note given   - Reviewed surgical pathology; Appendicitis   - He can follow up on as needed basis; He understands to call with questions/concerns  -- Arthea Platt, PA-C [facility] 06/27/2024, 10:50 AM M-F: 7am - 4pm

## 2024-07-03 NOTE — Anesthesia Postprocedure Evaluation (Signed)
 Anesthesia Post Note  Patient: [name]  Procedure(s) Performed: APPENDECTOMY, ROBOT-ASSISTED, LAPAROSCOPIC (Abdomen)  Patient location during evaluation: PACU Anesthesia Type: General Level of consciousness: awake and alert Pain management: pain level controlled Vital Signs Assessment: post-procedure vital signs reviewed and stable Respiratory status: spontaneous breathing, nonlabored ventilation and respiratory function stable Cardiovascular status: blood pressure returned to baseline and stable Postop Assessment: no apparent nausea or vomiting Anesthetic complications: no   No notable events documented.   Last Vitals:  Vitals:   06/23/24 0405 06/23/24 0820  BP: 108/68 117/76  Pulse: 73 78  Resp: 17 16  Temp: (!) 36.4 C 36.6 C  SpO2: 97% 97%    Last Pain:  Vitals:   06/22/24 1930  TempSrc:   PainSc: 0-No pain                 Camellia Merilee Louder
